# Patient Record
Sex: Male | Born: 1980 | Race: Black or African American | Hispanic: No | Marital: Single | Smoking: Current every day smoker
Health system: Southern US, Community
[De-identification: ages and names within clinical notes are randomized; demographics above are authoritative.]

## PROBLEM LIST (undated history)

## (undated) DIAGNOSIS — F1010 Alcohol abuse, uncomplicated: Secondary | ICD-10-CM

## (undated) DIAGNOSIS — K2920 Alcoholic gastritis without bleeding: Secondary | ICD-10-CM

## (undated) HISTORY — PX: FOREARM SURGERY: SHX651

---

## 2007-04-05 ENCOUNTER — Emergency Department (HOSPITAL_COMMUNITY): Admission: EM | Admit: 2007-04-05 | Discharge: 2007-04-05 | Payer: Self-pay | Admitting: Emergency Medicine

## 2008-02-21 ENCOUNTER — Emergency Department (HOSPITAL_COMMUNITY): Admission: EM | Admit: 2008-02-21 | Discharge: 2008-02-21 | Payer: Self-pay | Admitting: Emergency Medicine

## 2009-11-09 ENCOUNTER — Emergency Department (HOSPITAL_COMMUNITY): Admission: EM | Admit: 2009-11-09 | Discharge: 2009-11-09 | Payer: Self-pay | Admitting: Emergency Medicine

## 2010-04-28 ENCOUNTER — Observation Stay (HOSPITAL_COMMUNITY)
Admission: EM | Admit: 2010-04-28 | Discharge: 2010-04-29 | Payer: Self-pay | Source: Home / Self Care | Admitting: Emergency Medicine

## 2010-05-29 ENCOUNTER — Emergency Department (HOSPITAL_COMMUNITY)
Admission: EM | Admit: 2010-05-29 | Discharge: 2010-05-30 | Payer: Self-pay | Source: Home / Self Care | Admitting: Emergency Medicine

## 2010-08-18 LAB — POCT I-STAT, CHEM 8
BUN: 7 mg/dL (ref 6–23)
Calcium, Ion: 1.05 mmol/L — ABNORMAL LOW (ref 1.12–1.32)
HCT: 44 % (ref 39.0–52.0)
Hemoglobin: 15 g/dL (ref 13.0–17.0)

## 2010-08-18 LAB — ABO/RH: ABO/RH(D): O NEG

## 2010-08-18 LAB — CBC
MCH: 33.3 pg (ref 26.0–34.0)
MCHC: 34.5 g/dL (ref 30.0–36.0)
RDW: 13.1 % (ref 11.5–15.5)

## 2010-08-18 LAB — HEMOGLOBIN AND HEMATOCRIT, BLOOD: Hemoglobin: 11.2 g/dL — ABNORMAL LOW (ref 13.0–17.0)

## 2010-08-18 LAB — DIFFERENTIAL
Basophils Absolute: 0 10*3/uL (ref 0.0–0.1)
Lymphocytes Relative: 25 % (ref 12–46)
Neutro Abs: 4.3 10*3/uL (ref 1.7–7.7)
Neutrophils Relative %: 66 % (ref 43–77)

## 2010-08-18 LAB — BASIC METABOLIC PANEL
CO2: 26 mEq/L (ref 19–32)
Chloride: 104 mEq/L (ref 96–112)
Creatinine, Ser: 0.85 mg/dL (ref 0.4–1.5)
Potassium: 3.8 mEq/L (ref 3.5–5.1)

## 2010-08-18 LAB — TYPE AND SCREEN
ABO/RH(D): O NEG
Antibody Screen: NEGATIVE

## 2011-03-08 LAB — COMPREHENSIVE METABOLIC PANEL
ALT: 11
BUN: 5 — ABNORMAL LOW
CO2: 26
Calcium: 9.4
Chloride: 105
GFR calc Af Amer: >60
GFR calc non Af Amer: >60
Potassium: 3.4 — ABNORMAL LOW
Sodium: 140
Total Bilirubin: 1.6 — ABNORMAL HIGH
Total Protein: 7

## 2011-03-08 LAB — POCT I-STAT, CHEM 8
BUN: 6
Calcium, Ion: 1.22
Chloride: 101
Creatinine, Ser: 1.2
Glucose, Bld: 120 — ABNORMAL HIGH
HCT: 46
Sodium: 140

## 2011-03-08 LAB — LIPASE, BLOOD: Lipase: 22

## 2012-02-10 ENCOUNTER — Emergency Department (HOSPITAL_COMMUNITY)
Admission: EM | Admit: 2012-02-10 | Discharge: 2012-02-11 | Disposition: A | Discharge status: Home / Self Care | Payer: Self-pay | Attending: Emergency Medicine | Admitting: Emergency Medicine

## 2012-02-10 ENCOUNTER — Emergency Department (HOSPITAL_COMMUNITY): Payer: Self-pay

## 2012-02-10 ENCOUNTER — Encounter (HOSPITAL_COMMUNITY): Payer: Self-pay | Admitting: Emergency Medicine

## 2012-02-10 DIAGNOSIS — M21339 Wrist drop, unspecified wrist: Secondary | ICD-10-CM | POA: Insufficient documentation

## 2012-02-10 DIAGNOSIS — F17200 Nicotine dependence, unspecified, uncomplicated: Secondary | ICD-10-CM | POA: Insufficient documentation

## 2012-02-10 DIAGNOSIS — R209 Unspecified disturbances of skin sensation: Secondary | ICD-10-CM | POA: Insufficient documentation

## 2012-02-10 NOTE — Progress Notes (Signed)
Orthopedic Tech Progress Note Patient Details:  JACKIE LITTLEJOHN Dec 25, 1980 [id number]  Ortho Devices Type of Ortho Device: Velcro wrist splint (bilateral) Ortho Device/Splint Location: LUE AND RUE  Ortho Device/Splint Interventions: Application   Kimberlly Norgard 02/10/2012, 11:13 PM

## 2012-02-10 NOTE — ED Notes (Addendum)
Pt reports when he woke up at 9:30- 10 am, he woke up and has been unable to bend bil wrists; pt denies injury; denies pain, + sensation, strong radial pulse, <2sec cap refill; no noted deformity; reports when went to sleep, his hands were normal; pt is able to use hands, just not bend at wrists

## 2012-02-10 NOTE — ED Notes (Signed)
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

## 2012-02-10 NOTE — ED Notes (Signed)
Name called no answer 

## 2012-02-12 NOTE — ED Provider Notes (Signed)
History     CSN: [id number]  Arrival date & time 02/10/12  1933   First MD Initiated Contact with Patient 02/10/12 2152      Chief Complaint  Patient presents with  . Hand Problem    (Consider location/radiation/quality/duration/timing/severity/associated sxs/prior treatment) The history is provided by the patient. No language interpreter was used.  31yo healthy male c/o bilateral wrist drop since he awoke this am.  2+ radial pulse.  Fine motor to fingers weakened.  Denies injury or sleeping in a position to cause radial nerve inpingement.  Good sensation to bilateral hands.    History reviewed. No pertinent past medical history.  History reviewed. No pertinent past surgical history.  History reviewed. No pertinent family history.  History  Substance Use Topics  . Smoking status: Current Everyday Smoker -- 0.5 packs/day  . Smokeless tobacco: Not on file  . Alcohol Use: Yes     occasion   Cc:  31yo otherwise healthy male c/o bilateral wrist drop.     Review of Systems  Constitutional: Negative.   HENT: Negative.   Eyes: Negative.   Respiratory: Negative.   Cardiovascular: Negative.   Gastrointestinal: Negative.   Musculoskeletal:       Bilateral wrist drop  Neurological: Negative.   Psychiatric/Behavioral: Negative.   All other systems reviewed and are negative.    Allergies  Review of patient's allergies indicates no known allergies.  Home Medications  No current outpatient prescriptions on file.  BP 122/80  Pulse 64  Temp 98.9 F (37.2 C) (Oral)  Resp 18  SpO2 100%  Physical Exam  Nursing note and vitals reviewed. Constitutional: He is oriented to person, place, and time. He appears well-developed and well-nourished.  HENT:  Head: Normocephalic.  Eyes: Conjunctivae and EOM are normal. Pupils are equal, round, and reactive to light.  Neck: Normal range of motion. Neck supple.  Cardiovascular: Normal rate.   Pulmonary/Chest: Effort normal.    Abdominal: Soft.  Musculoskeletal: Normal range of motion.       Bilateral Wrist drop with +cs  Neurological: He is alert and oriented to person, place, and time. No cranial nerve deficit.  Skin: Skin is warm and dry.  Psychiatric: He has a normal mood and affect.    ED Course  Procedures (including critical care time) Spoke with dr. Roseanne Reno who recommended CT cervical spine and neurologist follow up.   Labs Reviewed - No data to display Ct Cervical Spine Wo Contrast  02/11/2012  *RADIOLOGY REPORT*  Clinical Data: Bilateral hand numbness with wrist drop.  CT CERVICAL SPINE WITHOUT CONTRAST  Technique:  Multidetector CT imaging of the cervical spine was performed. Multiplanar CT image reconstructions were also generated.  Comparison: None.  Findings: The cervical alignment is normal.  There is no evidence of acute fracture or traumatic subluxation.  No acute soft tissue findings are demonstrated.  There is no osseous foraminal stenosis. A tiny calcified granuloma is noted at the right lung apex.  IMPRESSION: Negative cervical spine CT.   Original Report Authenticated By: Gerrianne Scale, M.D.      1. Wrist drop       MDM  Bilateral wrist drop upon awakening around 10am.  CT cervical spine shows no cause.  Shared visit with Dr. Lorenso Courier.  Bilateral wrist splints with neurology followup.  Patient and father agree.  Return if worse with headache or worsening weakness.          Remi Haggard, NP 02/12/12 [phone]

## 2012-02-18 NOTE — ED Provider Notes (Signed)
Medical screening examination/treatment/procedure(s) were conducted as a shared visit with non-physician practitioner(s) and myself.  I personally evaluated the patient during the encounter  Tobin Chad, MD 02/18/12 1457

## 2012-02-28 ENCOUNTER — Inpatient Hospital Stay (HOSPITAL_COMMUNITY)
Admission: EM | Admit: 2012-02-28 | Discharge: 2012-03-02 | DRG: 074 | Disposition: A | Discharge status: Home / Self Care | Payer: MEDICAID | Attending: Internal Medicine | Admitting: Internal Medicine

## 2012-02-28 ENCOUNTER — Encounter (HOSPITAL_COMMUNITY): Payer: Self-pay | Admitting: Emergency Medicine

## 2012-02-28 DIAGNOSIS — Z823 Family history of stroke: Secondary | ICD-10-CM

## 2012-02-28 DIAGNOSIS — F17200 Nicotine dependence, unspecified, uncomplicated: Secondary | ICD-10-CM | POA: Diagnosis present

## 2012-02-28 DIAGNOSIS — R29898 Other symptoms and signs involving the musculoskeletal system: Secondary | ICD-10-CM

## 2012-02-28 DIAGNOSIS — M21339 Wrist drop, unspecified wrist: Secondary | ICD-10-CM | POA: Diagnosis present

## 2012-02-28 DIAGNOSIS — G587 Mononeuritis multiplex: Principal | ICD-10-CM | POA: Diagnosis present

## 2012-02-28 DIAGNOSIS — F1010 Alcohol abuse, uncomplicated: Secondary | ICD-10-CM | POA: Diagnosis present

## 2012-02-28 MED ORDER — NICOTINE 21 MG/24HR TD PT24
21.0000 mg | MEDICATED_PATCH | Freq: Every day | TRANSDERMAL | Status: DC
  Administered 2012-02-28 – 2012-03-02 (×4): 21 mg via TRANSDERMAL
  Filled 2012-02-28 (×4): qty 1

## 2012-02-28 NOTE — ED Provider Notes (Signed)
History     CSN: [id number]  Arrival date & time 02/28/12  1617   First MD Initiated Contact with Patient 02/28/12 1718      Chief Complaint  Patient presents with  . Wrist Pain    (Consider location/radiation/quality/duration/timing/severity/associated sxs/prior treatment) HPI Comments: Wesley Stewart 31 y.o. male   The chief complaint is: Patient presents with:   Wrist Pain   31 y/o male presents today with 2 weeks of bilateral wrist drop. Patient was seen and evaluated 2 weeks ago for sudden onset wrist drop.  States that he just woke up and was unable to extend his wrists. Patient states that he is unable to perform ADLS such as brushing his teeth. Patient is unable to extend at the wrist. He is also unable to grip tightly. He denies any injury to his neck. He denies any recent history of upper respiratory infection or other viral illness or systemic illness. He denies any recent immunizations. Denies any weakness in the legs. He denies any difficulty swallowing or with speech. He denies any visual changes or facial asymmetries. His gait is unaffected. No difficulty breathing. Patient was discharged 2 weeks ago with negative CT of the neck and told to followup with neurology. He is uninsured and has been unable to work to gain money to get a visit with outpatient neurology. He is here tonight because his symptoms have not resolved. He also has new onset numbness in the median nerve distribution bilaterally. He states that he does have some pain in his wrists, and rates it at a 4/10 constantly. Denies fevers, chills, myalgias, arthralgias, nausea, vomiting, diarrhea.       Patient is a 30 y.o. male presenting with wrist pain. The history is provided by the patient and medical records. No language interpreter was used.  Wrist Pain Associated symptoms include weakness. Pertinent negatives include no abdominal pain, chest pain, chills, fever, headaches, myalgias, nausea, neck pain,  rash or vomiting.    History reviewed. No pertinent past medical history.  Past Surgical History  Procedure Date  . Forearm surgery     No family history on file.  History  Substance Use Topics  . Smoking status: Current Every Day Smoker -- 0.5 packs/day  . Smokeless tobacco: Not on file  . Alcohol Use: Yes     occasion      Review of Systems  Constitutional: Negative for fever and chills.  HENT: Negative for trouble swallowing, neck pain and voice change.   Eyes: Negative for visual disturbance.  Respiratory: Negative for shortness of breath.   Cardiovascular: Negative for chest pain.  Gastrointestinal: Negative for nausea, vomiting, abdominal pain and diarrhea.  Genitourinary: Negative for dysuria.  Musculoskeletal: Positive for gait problem. Negative for myalgias.  Skin: Negative for color change and rash.  Neurological: Positive for weakness. Negative for seizures, facial asymmetry, speech difficulty, light-headedness and headaches.    Allergies  Review of patient's allergies indicates no known allergies.  Home Medications  No current outpatient prescriptions on file.  BP 133/70  Pulse 78  Temp 98.2 F (36.8 C) (Oral)  Resp 20  Wt 150 lb (68.04 kg)  SpO2 98%  Physical Exam  Nursing note and vitals reviewed. Constitutional: He is oriented to person, place, and time. He appears well-developed and well-nourished. No distress.  HENT:  Head: Normocephalic and atraumatic.  Eyes: Conjunctivae normal and EOM are normal. Pupils are equal, round, and reactive to light. Right eye exhibits no discharge. Left eye exhibits no  discharge.  Neck: Normal range of motion. Neck supple. No tracheal deviation present. No thyromegaly present.  Cardiovascular: Normal rate, regular rhythm, normal heart sounds and intact distal pulses.  Exam reveals no gallop and no friction rub.   No murmur heard. Pulmonary/Chest: Effort normal and breath sounds normal. No respiratory distress.  He has no wheezes. He exhibits no tenderness.  Abdominal: Soft. Bowel sounds are normal. He exhibits no distension and no mass. There is no tenderness. There is no guarding.  Musculoskeletal:       Right wrist: He exhibits decreased range of motion. He exhibits no tenderness, no bony tenderness, no swelling, no effusion, no crepitus, no deformity and no laceration.       Left wrist: He exhibits decreased range of motion. He exhibits no tenderness, no bony tenderness, no swelling, no effusion, no crepitus, no deformity and no laceration.  Lymphadenopathy:    He has no cervical adenopathy.  Neurological: He is alert and oriented to person, place, and time. He displays no tremor. No cranial nerve deficit or sensory deficit. He exhibits normal muscle tone (decreased muscle tone in the forearm muscles, particularly in the forearm extensors.). GCS eye subscore is 4. GCS verbal subscore is 5. GCS motor subscore is 6.       Decreased tone of the forearm extensors may be borderline on atrophy. Patient has decreased grip strength bilaterally, and unable to form a fist. He is totally unable to extend at the wrist. He does have some flexion. Strength is markedly diminished. There is decreased strength of biceps and tricep, bilaterally. Sensation is grossly intact in the fingers, although patient does report numbness and tingling in the median nerve distribution. DTRs appear normal. Patient has full strength at the shoulder. There is no involvement of the lower extremities.  Skin: He is not diaphoretic.    ED Course  Procedures (including critical care time)  Labs Reviewed - No data to display No results found.   1. Weakness of wrist       MDM  The patient is uninsured and unable to complete ADLs. I feel that he feels that he is appropriate for admission for further workup for his bilateral wrist drop of unknown etiology. He may have a Guillain-Barr syndrome, however, there is no involvement of the  lower extremities, and this would be a very atypical presentation. Patient is a smoker, but has no other known risk factors for stroke and bilateral presentation does not necessarily  lineup with stroke presentation. Not feel that the patient needs further workup at this time. Spoke with Dr. Jeraldine Loots, who agrees with my plan. Consulted Dr. Amada Jupiter with inpatient neurology, who agrees the patient needs admission and will come to the ED to assess and see him here.    I have spoken with internal medicine who has agreed to to admit the patient. Patient understands and agrees with plan.        Arthor Captain, PA-C 03/01/12 2112

## 2012-02-28 NOTE — ED Notes (Signed)
Pt seen at [facility] two weeks ago for inability to move wrists.  No reported injury and patient was diagnosed with wrist drop.  Pt wearing wrist splints bilaterally since then.  Did not follow up with a neurologist per instructions.  Now he returns with bilateral forearm and wrist numbness.  Good pulses bilaterally and able to move fingers.  Cannot bend wrists.

## 2012-02-28 NOTE — Consult Note (Addendum)
Reason for Consult: Bilateral wrist drop Referring Physician: Remi Haggard NP  CC: Bilateral wrist weakness  History is obtained from: Patient  HPI: Wesley Stewart is an 31 y.o. male presented with 2 weeks of bilateral wrist weakness. He states that he woke up 2 weeks ago and was unable to extend his wrist bilaterally. Since that time, his weakness has been stable but are getting worse or better. He has, however noticed that he has been getting numbness that seems to be involving more and more of his distal arms. He also notes pain in his arms bilaterally. He denies any neck pain. Denies any injury to either his arms or his neck prior to starting.  He did not sleep in any unusual position the night that this happened, consent that is normal.  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  Past medical history None   Family History: Grandmother-breast cancer No history of autoimmune diseases as far as he is aware  Social History: Tob: Positive, half pack a day  Exam: Current vital signs: BP 133/70  Pulse 78  Temp 98.2 F (36.8 C) (Oral)  Resp 20  Wt 68.04 kg (150 lb)  SpO2 98% Vital signs in last 24 hours: Temp:  [98.2 F (36.8 C)] 98.2 F (36.8 C) (09/23 1729) Pulse Rate:  [78] 78  (09/23 1729) Resp:  [20] 20  (09/23 1729) BP: (133)/(70) 133/70 mmHg (09/23 1729) SpO2:  [98 %] 98 % (09/23 1729) Weight:  [68.04 kg (150 lb)] 68.04 kg (150 lb) (09/23 1729)  General: Sitting in a chair, watching TV  CV: Regular rate and rhythm  Mental Status: Patient is awake, alert, oriented to person, place, month, year, and situation. Immediate and remote memory are intact. Patient is able to give a clear and coherent history. Speech is fluent and non-dysarthric  Cranial Nerves: II: Visual Fields are full. Pupils are equal, round, and reactive to light.  Discs are  sharp. III,IV, VI: EOMI without ptosis or diploplia.  V,VII: Facial sensation and movement are symmetric.  VIII: hearing is intact to voice X: Uvula elevates symmetrically XI: Shoulder shrug is symmetric. XII: tongue is midline without atrophy or fasciculations.  Motor: Tone is normal. Bulk is normal.      Right  Left Shoulder Abduction  5/5  5/5 Elbow Flexion   5/5  5/5 Elbow Extension  5/5  5/5 Wrist Flexion   4/5  4/5 Wrist Extension  0/5  0/5 Interossei   3/5  3/5 Grip: 5/5 bilaterally if hand is held in neutral position.  Apb: 1/5 bilterally  Hip Flexion   5/5  5/5 Knee Flexion   5/5  5/5 Knee Extension  5/5  5/5 Ankle Dorsiflexion  5/5  5/5 Ankle Plantarflexion  5/5  5/5       Sensory: Sensation is symmetric to light touch and temperature in the legs. Vibration is intact in the legs. In the arms, he has circumferentially decreased sensation to temperature and vibration to the elbow bilaterally.  Deep Tendon Reflexes: 2+ and symmetric in the biceps, triceps, 1+ at the BR and 2+ at the patellae.  Plantars: Toes are downgoing bilaterally.  Cerebellar: No dysmetria, FNF limited by wrist weakness Gait: Patient has a stable casual gait. Able to tandem, heel, and toe walk.   Problems addressed:Weakness, Progressive numbness  Impression: 31 yo M with  2 weeks of sudden onset symmetric weakness followed by symmetric circumferential arm numbness that has been progressively getting worse. With the symmetry, I am concerned for possible C-Spine disease(inflammatory or structural), but also in the differential would be GBS variant vs mononeuritis multiplex. His weakness fits a C8 distribution, however his numbness is more circumferential. Mulitple nerves are involved (sensory distribution is median, ulnar and radial, weakness is also radial, ulnar and median) making think that a mononeuritis multiplex is less likely. Multifocal motor neuropathy could be a possibility as well.    Recommendations: 1) MRI C-spine with and without contrast.  2) If this is negative, then would arrange LP under fluoro with Cellcount/diff, Protein, Glucose.  3) Nerve conduction studies could also be very helpful, however this is typically not possible as an inpatient.  4) OT consult.    Ritta Slot, MD [facility] [phone]

## 2012-02-29 ENCOUNTER — Inpatient Hospital Stay (HOSPITAL_COMMUNITY): Payer: Self-pay

## 2012-02-29 ENCOUNTER — Encounter (HOSPITAL_COMMUNITY): Payer: Self-pay | Admitting: Family Medicine

## 2012-02-29 DIAGNOSIS — R29898 Other symptoms and signs involving the musculoskeletal system: Secondary | ICD-10-CM | POA: Insufficient documentation

## 2012-02-29 LAB — COMPREHENSIVE METABOLIC PANEL
ALT: 12 U/L (ref 0–53)
AST: 21 U/L (ref 0–37)
Albumin: 3.7 g/dL (ref 3.5–5.2)
Alkaline Phosphatase: 62 U/L (ref 39–117)
BUN: 15 mg/dL (ref 6–23)
CO2: 27 mEq/L (ref 19–32)
GFR calc Af Amer: >90 mL/min (ref >90)
GFR calc non Af Amer: >90 mL/min (ref >90)
Sodium: 133 mEq/L — ABNORMAL LOW (ref 135–145)
Total Bilirubin: 0.6 mg/dL (ref 0.3–1.2)
Total Protein: 7 g/dL (ref 6.0–8.3)

## 2012-02-29 LAB — CBC WITH DIFFERENTIAL/PLATELET
Lymphocytes Relative: 46 % (ref 12–46)
Lymphs Abs: 2.4 10*3/uL (ref 0.7–4.0)
MCH: 32.4 pg (ref 26.0–34.0)
Monocytes Relative: 9 % (ref 3–12)
Neutrophils Relative %: 43 % (ref 43–77)
Platelets: 257 10*3/uL (ref 150–400)
WBC: 5.2 10*3/uL (ref 4.0–10.5)

## 2012-02-29 LAB — URINALYSIS, ROUTINE W REFLEX MICROSCOPIC
Bilirubin Urine: NEGATIVE
Ketones, ur: NEGATIVE mg/dL
Leukocytes, UA: NEGATIVE
Nitrite: NEGATIVE
Specific Gravity, Urine: 1.016 (ref 1.005–1.030)
Urobilinogen, UA: 0.2 mg/dL (ref 0.0–1.0)
pH: 6 (ref 5.0–8.0)

## 2012-02-29 LAB — BASIC METABOLIC PANEL
Calcium: 9.1 mg/dL (ref 8.4–10.5)
Creatinine, Ser: 1 mg/dL (ref 0.50–1.35)
GFR calc Af Amer: >90 mL/min (ref >90)
Sodium: 136 mEq/L (ref 135–145)

## 2012-02-29 LAB — CBC
MCH: 32.8 pg (ref 26.0–34.0)
MCV: 93.6 fL (ref 78.0–100.0)
Platelets: 233 10*3/uL (ref 150–400)
RBC: 4.54 MIL/uL (ref 4.22–5.81)
RDW: 14.8 % (ref 11.5–15.5)
WBC: 5.3 10*3/uL (ref 4.0–10.5)

## 2012-02-29 MED ORDER — SODIUM CHLORIDE 0.9 % IJ SOLN: 3.0000 mL | INTRAMUSCULAR | Status: DC | PRN

## 2012-02-29 MED ORDER — METHOCARBAMOL 500 MG PO TABS
500.0000 mg | ORAL_TABLET | Freq: Four times a day (QID) | ORAL | Status: DC | PRN
  Administered 2012-02-29 – 2012-03-01 (×3): 500 mg via ORAL
  Filled 2012-02-29 (×3): qty 1

## 2012-02-29 MED ORDER — ONDANSETRON HCL 4 MG PO TABS: 4.0000 mg | ORAL_TABLET | Freq: Four times a day (QID) | ORAL | Status: DC | PRN

## 2012-02-29 MED ORDER — GADOBENATE DIMEGLUMINE 529 MG/ML IV SOLN
14.0000 mL | Freq: Once | INTRAVENOUS | Status: AC | PRN
  Administered 2012-02-29: 14 mL via INTRAVENOUS

## 2012-02-29 MED ORDER — ZOLPIDEM TARTRATE 5 MG PO TABS: 5.0000 mg | ORAL_TABLET | Freq: Every evening | ORAL | Status: DC | PRN

## 2012-02-29 MED ORDER — PNEUMOCOCCAL VAC POLYVALENT 25 MCG/0.5ML IJ INJ
0.5000 mL | INJECTION | INTRAMUSCULAR | Status: AC
  Filled 2012-02-29: qty 0.5

## 2012-02-29 MED ORDER — HYDROCODONE-ACETAMINOPHEN 5-325 MG PO TABS
1.0000 | ORAL_TABLET | ORAL | Status: DC | PRN
  Administered 2012-02-29 – 2012-03-02 (×8): 1 via ORAL
  Filled 2012-02-29 (×8): qty 1

## 2012-02-29 MED ORDER — ENOXAPARIN SODIUM 40 MG/0.4ML ~~LOC~~ SOLN
40.0000 mg | SUBCUTANEOUS | Status: DC
  Administered 2012-02-29: 40 mg via SUBCUTANEOUS
  Filled 2012-02-29: qty 0.4

## 2012-02-29 MED ORDER — INFLUENZA VIRUS VACC SPLIT PF IM SUSP
0.5000 mL | INTRAMUSCULAR | Status: AC
  Filled 2012-02-29: qty 0.5

## 2012-02-29 MED ORDER — SENNOSIDES-DOCUSATE SODIUM 8.6-50 MG PO TABS
1.0000 | ORAL_TABLET | Freq: Every evening | ORAL | Status: DC | PRN
  Filled 2012-02-29: qty 1

## 2012-02-29 MED ORDER — ALUM & MAG HYDROXIDE-SIMETH 200-200-20 MG/5ML PO SUSP: 30.0000 mL | Freq: Four times a day (QID) | ORAL | Status: DC | PRN

## 2012-02-29 MED ORDER — ACETAMINOPHEN 325 MG PO TABS
650.0000 mg | ORAL_TABLET | Freq: Four times a day (QID) | ORAL | Status: DC | PRN
  Administered 2012-02-29 (×2): 650 mg via ORAL
  Filled 2012-02-29 (×2): qty 2

## 2012-02-29 MED ORDER — ACETAMINOPHEN 650 MG RE SUPP: 650.0000 mg | Freq: Four times a day (QID) | RECTAL | Status: DC | PRN

## 2012-02-29 MED ORDER — ONDANSETRON HCL 4 MG/2ML IJ SOLN: 4.0000 mg | Freq: Four times a day (QID) | INTRAMUSCULAR | Status: DC | PRN

## 2012-02-29 NOTE — Progress Notes (Signed)
UR completed 

## 2012-02-29 NOTE — Progress Notes (Signed)
CARE MANAGEMENT NOTE 02/29/2012  Patient:  Wesley Stewart, Wesley Stewart   Account Number:  [id number]  Date Initiated:  02/29/2012  Documentation initiated by:  Colleen Can  Subjective/Objective Assessment:   DX BILATERAL WRIST DROP  PT IS SELFPAY ADMISSION  From home with Grandmother     Action/Plan:   CM spoke with patient. Plans to go stay with cousin in [place] upon discharge.  States he does not have PCP. Does not have medical insurance. 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 necessity/level of care/duration of stay   Comments:  02/29/2012 referral made to [facility]. CM will con't to follow

## 2012-02-29 NOTE — Evaluation (Signed)
Occupational Therapy Evaluation Patient Details Name: Wesley Stewart MRN: [id number] DOB: 06/06/1981 Today's Date: 02/29/2012 Time: 1191-4782 OT Time Calculation (min): 26 min  OT Assessment / Plan / Recommendation Clinical Impression  Pt is 31 years old and presents with bilateral wrist drop. He will benefit from skilled OT services to monitor symptoms, provide AE education PRN/compensatory strategies and increase independence. Awaiting MRI today.     OT Assessment  Patient needs continued OT Services    Follow Up Recommendations  Outpatient OT    Barriers to Discharge      Equipment Recommendations  None recommended by OT    Recommendations for Other Services    Frequency  Min 2X/week    Precautions / Restrictions Precautions Precaution Comments: bilateral wrist drop        ADL  Eating/Feeding: Simulated;Independent;Other (comment) (to drink from a cup with compensatory strategy. ) Where Assessed - Eating/Feeding: Other (comment) (standing.) Grooming: Performed;Teeth care;Independent Where Assessed - Grooming: Unsupported standing;Other (comment) (while in wrist supports. difficult and painful) Upper Body Bathing: Other (comment) (not assessed) Lower Body Bathing: Other (comment) (not assessed) Upper Body Dressing: Other (comment) (not assessed) Lower Body Dressing: Simulated;Independent;Other (comment) (with socks only. difficult) Where Assessed - Lower Body Dressing: Unsupported sitting Toilet Transfer: Simulated;Independent Toilet Transfer Method: Other (comment) (transfer into bathroom and out) Toileting - Architect and Hygiene: Simulated;Independent Where Assessed - Toileting Clothing Manipulation and Hygiene: Standing Tub/Shower Transfer Method: Not assessed Equipment Used: Other (comment) (bilateral wrist supports. ) ADL Comments: Observed pt brush teeth at sink. pt able to manipulate and open box that toothpaste is in and open tube/apply to  toothbrush and complete brushing. he states this is very difficult and he has sharp pain that runs down his R forearm when he brushes teeth. Pt able to pick up a cup and drink using compensatory strategy. (picking up cup from top edge as he is unable to grasp around cup) He states eating breakfast was difficult and he mostly picked things up wtih his fingers to feed himself. he states he tried a fork at home  before coming to hospital and he could grip utensil and not drop utensil but the food would fall of fork when he brought fork to mouth. Pt declined bath currently and states he would try later. Will further assess ADL as able while on acute. He has limited support when he leaves hospital.     OT Diagnosis: Generalized weakness  OT Problem List: Decreased strength;Decreased range of motion;Impaired sensation OT Treatment Interventions: Self-care/ADL training;Therapeutic activities;Therapeutic exercise;Patient/family education   OT Goals Acute Rehab OT Goals OT Goal Formulation: With patient Time For Goal Achievement: 03/14/12 Potential to Achieve Goals: Good ADL Goals Additional ADL Goal #1: Will further assess self feeding and determine any AE needs/provide strategies for increased independence with task.  ADL Goal: Additional Goal #1 - Progress: Goal set today Additional ADL Goal #2: Will further assess pt ability to perform own bath/dress routine and determine any recommendations needed.  ADL Goal: Additional Goal #2 - Progress: Goal set today  Visit Information  Last OT Received On: 02/29/12 Assistance Needed: +1    Subjective Data  Subjective: I had trouble eating this morning Patient Stated Goal: to be able to use hands   Prior Functioning  Vision/Perception  Home Living Lives With: Family;Other (Comment) (cousin who works) Available Help at Discharge: Other (Comment) (cousin works. 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 able to flex digits but not able to extend. No abduction or adduction of digits. Pt with bilateral wrist braces RUE Sensation: Deficits RUE Sensation Deficits: pt states no sensation below elbow to fingertips. Not able to detect light touch any areas that OT tested elbow to distal.  Left Upper Extremity Assessment LUE ROM/Strength/Tone: Deficits LUE ROM/Strength/Tone Deficits: same as above LUE Sensation: Deficits LUE Sensation Deficits: same as above.    Mobility  Shoulder Instructions  Bed Mobility Bed Mobility: Supine to Sit Supine to Sit: 7: Independent Transfers Transfers: Sit to Stand;Stand to Sit Sit to Stand: 7: Independent Stand to Sit: 7: Independent       Exercise     Balance Balance Balance Assessed: Yes Dynamic Standing Balance Dynamic Standing - Level of Assistance: 7: Independent   End of Session OT - End of Session Activity Tolerance: Patient tolerated treatment well Patient left: in bed;with call bell/phone within reach  GO     Lennox Laity [phone] 02/29/2012, 11:59 AM

## 2012-02-29 NOTE — Progress Notes (Signed)
[facility] PROGRESS NOTE  Wesley Stewart RUE:[id number] DOB: 1981-03-25 DOA: 02/28/2012 PCP: Sheila Oats, MD  Assessment/Plan: 1. Sudden onset symmetric weakness/bilateral wrist drop--follow-up MRI cervical spine. Further evaluation per neurology. Check HIV.  Code Status: full code Family Communication: none present Disposition Plan: home when stable  Brendia Sacks, MD  [facility] Team 6 Pager [phone]. If 8PM-8AM, please contact night-coverage at [email or web address], password [facility] 02/29/2012, 10:24 AM  LOS: 1 day   Brief narrative: 31 y.o. male presented with 2 weeks of sudden onset symmetric weakness followed by symmetric circumferential arm numbness that has been progressively getting worse.  Consultants:  Neurology   OT--outpatient OT  Procedures:    Antibiotics:    HPI/Subjective: No complaints except for weakness both hands/wrists.  Objective: Filed Vitals:   02/29/12 0034 02/29/12 0136 02/29/12 0159 02/29/12 0539  BP: 119/86 124/83  122/82  Pulse: 61 60  56  Temp: 98.4 F (36.9 C) 97.9 F (36.6 C)  97.8 F (36.6 C)  TempSrc: Oral Oral  Oral  Resp: 16 18  16   Height:   5\' 10"  (1.778 m)   Weight:      SpO2: 100% 100%  100%   No intake or output data in the 24 hours ending 02/29/12 1024 Filed Weights   02/28/12 1729  Weight: 68.04 kg (150 lb)    Exam:  General:  Appears calm and comfortable Cardiovascular: RRR, no m/r/g. No LE edema. Respiratory: CTA bilaterally, no w/r/r. Normal respiratory effort. Musculoskeletal: bilateral wrist drop noted, inability to move fingers Psychiatric: grossly normal mood and affect, speech fluent and appropriate  Data Reviewed: Basic Metabolic Panel:  Lab 02/29/12 6213 02/29/12 0016  NA 136 133*  K 3.9 3.7  CL 101 97  CO2 26 27  GLUCOSE 88 90  BUN 13 15  CREATININE 1.00 0.95  CALCIUM 9.1 9.5  MG -- 2.2  PHOS -- --   Liver Function Tests:  Lab 02/29/12 0016  AST 21  ALT 12  ALKPHOS 62   BILITOT 0.6  PROT 7.0  ALBUMIN 3.7   CBC:  Lab 02/29/12 0430 02/29/12 0016  WBC 5.3 5.2  NEUTROABS -- 2.2  HGB 14.9 15.3  HCT 42.5 44.3  MCV 93.6 93.9  PLT 233 257   Studies: No results found.  Scheduled Meds:   . enoxaparin (LOVENOX) injection  40 mg Subcutaneous Q24H  . influenza  inactive virus vaccine  0.5 mL Intramuscular Tomorrow-1000  . nicotine  21 mg Transdermal Daily  . pneumococcal 23 valent vaccine  0.5 mL Intramuscular Tomorrow-1000   Continuous Infusions:   Active Problems:  Weakness of wrist     Brendia Sacks, MD  [facility] Team 6 Pager [phone]. If 8PM-8AM, please contact night-coverage at [email or web address], password [facility] 02/29/2012, 10:24 AM  LOS: 1 day   Time spent: 20 minutes

## 2012-02-29 NOTE — ED Notes (Signed)
[id number]> Expected date:<BR> Expected time:<BR> Means of arrival:<BR> Comments:<BR> Detox; DT

## 2012-02-29 NOTE — Progress Notes (Signed)
[facility] PROGRESS [facility]    SUBJECTIVE   No complaints.  No improvement in bilateral wrist extension  OBJECTIVE   Vital signs in last 24 hours: Temp:  [97.8 F (36.6 C)-98.4 F (36.9 C)] 97.8 F (36.6 C) (09/24 0539) Pulse Rate:  [56-78] 56  (09/24 0539) Resp:  [16-20] 16  (09/24 0539) BP: (119-133)/(70-86) 122/82 mmHg (09/24 0539) SpO2:  [98 %-100 %] 100 % (09/24 0539) Weight:  [68.04 kg (150 lb)] 68.04 kg (150 lb) (09/23 1729)  Intake/Output from previous day:   Intake/Output this shift:   Nutritional status: General  History reviewed. No pertinent past medical history.  Neurologic ROS negative with exception of above. Musculoskeletal ROS weakness  Neurologic Exam:  Mental Status: Alert, oriented, thought content appropriate.  Speech fluent without evidence of aphasia.  Able to follow 3 step commands without difficulty. Cranial Nerves: II: visual fields grossly normal, pupils equal, round, reactive to light and accommodation III,IV, VI: ptosis not present, extraocular muscles extra-ocular motions intact bilaterally V,VII: smile symmetric, facial light touch sensation normal bilaterally VIII: hearing normal bilaterally IX,X: gag reflex present XI: trapezius strength/neck flexion strength normal bilaterally XII: tongue strength normal  Motor: Right : Upper extremity    Left:     Upper extremity 5/5 deltoid       5/5 deltoid 5/5 tricep      5/5 tricep 5/5 biceps      5/5 biceps  5/5wrist flexion     5/5 wrist flexion 0/5 wrist extension     0/5 wrist extension 2/5 hand grip      2/5 hand grip Bilaterally he has some flexion of fingers --greater in thumb and index finger.  No adduction abduction of fingers, no extension of MCP,PIP, DIP.  Lower extremity     Lower extremity 5/5 hip flexor      5/5 hip flexor 5/5 hip adductors     5/5 hip adductors 5/5 hip abductors     5/5 hip abductors 5/5 quadricep      5/5  quadriceps  5/5 hamstrings     5/5 hamstrings 5/5 plantar flexion       5/5 plantar flexion 5/5 plantar extension     5/5 plantar extension Tone and bulk:normal tone throughout; no atrophy noted Sensory: Pinprick and light touch intact throughout, bilaterally--Except bilaterally in both arms from one inch below elbow to fingers.  On the left arm he states he feels some sensation in T1 region otherwise no sensation to pp/lt.  Deep Tendon Reflexes:  Right: Upper Extremity   Left: Upper extremity   biceps (C-5 to C-6) 2/4   biceps (C-5 to C-6) 2/4 tricep (C7) 2/4    triceps (C7) 2/4 Brachioradialis (C6) 2/4  Brachioradialis (C6) 2/4  Lower Extremity Lower Extremity  quadriceps (L-2 to L-4) 2/4   quadriceps (L-2 to L-4) 2/4 Achilles (S1) 2/4   Achilles (S1) 2/4 Plantars: Right: downgoing   Left: downgoing Cerebellar: normal finger-to-nose,  normal heel-to-shin test normal gait and station   Lab Results: Results for orders placed during the hospital encounter of 02/28/12 (from the past 24 hour(s))  CBC WITH DIFFERENTIAL     Status: Normal   Collection Time   02/29/12 12:16 AM      Component Value Range  WBC 5.2  4.0 - 10.5 K/uL   RBC 4.72  4.22 - 5.81 MIL/uL   Hemoglobin 15.3  13.0 - 17.0 g/dL   HCT 16.1  09.6 - 04.5 %   MCV 93.9  78.0 - 100.0 fL   MCH 32.4  26.0 - 34.0 pg   MCHC 34.5  30.0 - 36.0 g/dL   RDW 40.9  81.1 - 91.4 %   Platelets 257  150 - 400 K/uL   Neutrophils Relative 43  43 - 77 %   Neutro Abs 2.2  1.7 - 7.7 K/uL   Lymphocytes Relative 46  12 - 46 %   Lymphs Abs 2.4  0.7 - 4.0 K/uL   Monocytes Relative 9  3 - 12 %   Monocytes Absolute 0.4  0.1 - 1.0 K/uL   Eosinophils Relative 2  0 - 5 %   Eosinophils Absolute 0.1  0.0 - 0.7 K/uL   Basophils Relative 1  0 - 1 %   Basophils Absolute 0.0  0.0 - 0.1 K/uL  COMPREHENSIVE METABOLIC PANEL     Status: Abnormal   Collection Time   02/29/12 12:16 AM      Component Value Range   Sodium 133 (*) 135 - 145 mEq/L    Potassium 3.7  3.5 - 5.1 mEq/L   Chloride 97  96 - 112 mEq/L   CO2 27  19 - 32 mEq/L   Glucose, Bld 90  70 - 99 mg/dL   BUN 15  6 - 23 mg/dL   Creatinine, Ser 7.82  0.50 - 1.35 mg/dL   Calcium 9.5  8.4 - 95.6 mg/dL   Total Protein 7.0  6.0 - 8.3 g/dL   Albumin 3.7  3.5 - 5.2 g/dL   AST 21  0 - 37 U/L   ALT 12  0 - 53 U/L   Alkaline Phosphatase 62  39 - 117 U/L   Total Bilirubin 0.6  0.3 - 1.2 mg/dL   GFR calc non Af Amer >90  >90 mL/min   GFR calc Af Amer >90  >90 mL/min  MAGNESIUM     Status: Normal   Collection Time   02/29/12 12:16 AM      Component Value Range   Magnesium 2.2  1.5 - 2.5 mg/dL  URINALYSIS, ROUTINE W REFLEX MICROSCOPIC     Status: Normal   Collection Time   02/29/12  1:05 AM      Component Value Range   Color, Urine YELLOW  YELLOW   APPearance CLEAR  CLEAR   Specific Gravity, Urine 1.016  1.005 - 1.030   pH 6.0  5.0 - 8.0   Glucose, UA NEGATIVE  NEGATIVE mg/dL   Hgb urine dipstick NEGATIVE  NEGATIVE   Bilirubin Urine NEGATIVE  NEGATIVE   Ketones, ur NEGATIVE  NEGATIVE mg/dL   Protein, ur NEGATIVE  NEGATIVE mg/dL   Urobilinogen, UA 0.2  0.0 - 1.0 mg/dL   Nitrite NEGATIVE  NEGATIVE   Leukocytes, UA NEGATIVE  NEGATIVE  CBC     Status: Normal   Collection Time   02/29/12  4:30 AM      Component Value Range   WBC 5.3  4.0 - 10.5 K/uL   RBC 4.54  4.22 - 5.81 MIL/uL   Hemoglobin 14.9  13.0 - 17.0 g/dL   HCT 21.3  08.6 - 57.8 %   MCV 93.6  78.0 - 100.0 fL   MCH 32.8  26.0 - 34.0 pg   MCHC 35.1  30.0 - 36.0 g/dL   RDW 56.3  87.5 - 64.3 %   Platelets 233  150 - 400 K/uL  BASIC METABOLIC PANEL     Status: Normal   Collection Time   02/29/12  4:30 AM      Component Value Range   Sodium 136  135 - 145 mEq/L   Potassium 3.9  3.5 - 5.1 mEq/L   Chloride 101  96 - 112 mEq/L   CO2 26  19 - 32 mEq/L   Glucose, Bld 88  70 - 99 mg/dL   BUN 13  6 - 23 mg/dL   Creatinine, Ser 3.29  0.50 - 1.35 mg/dL   Calcium 9.1  8.4 - 51.8 mg/dL   GFR calc non Af Amer >90  >90  mL/min   GFR calc Af Amer >90  >90 mL/min   Lipid Panel No results found for this basename: CHOL,TRIG,HDL,CHOLHDL,VLDL,LDLCALC in the last 72 hours  Studies/Results: No results found.  Medications:     Scheduled:   . enoxaparin (LOVENOX) injection  40 mg Subcutaneous Q24H  . influenza  inactive virus vaccine  0.5 mL Intramuscular Tomorrow-1000  . nicotine  21 mg Transdermal Daily  . pneumococcal 23 valent vaccine  0.5 mL Intramuscular Tomorrow-1000    Assessment/Plan:    Impression: 31 yo M with 2 weeks of sudden onset symmetric weakness followed by symmetric circumferential arm numbness that has been progressively getting worse. With the symmetry, I am concerned for possible C-Spine disease(inflammatory or structural), but also in the differential would be GBS variant vs mononeuritis multiplex. His weakness fits a C8 distribution, however his numbness is more circumferential. Mulitple nerves are involved (sensory distribution is median, ulnar and radial, weakness is also radial, ulnar and median) making think that a mononeuritis multiplex is less likely. Multifocal motor neuropathy could be a possibility as well.  Recommendations:  1) MRI C-spine with and without contrast.  2) If this is negative, then would arrange LP under fluoro with Cellcount/diff, Protein, Glucose.  3) Nerve conduction studies could also be very helpful, however this is typically not possible as an inpatient.  4) OT consult.      Felicie Morn PA-C [facility] [phone]  02/29/2012, 8:50 AM

## 2012-02-29 NOTE — H&P (Signed)
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 He came to the ER where imaging was done, it was unrevealing he was discharged home. However, he states his weakness has continued to worsen. He reports an episode of diarrhea for approximately a week, last week. He reports no other viral issues. He denies any fevers or chills. He denies any trauma. He states he is weak from the elbows down. He came back to the ER. He has been seen by a neurologist Dr. Amada Jupiter.  Review of Systems:  The patient denies anorexia, fever, weight loss,, vision loss, decreased hearing, hoarseness, chest pain, syncope, dyspnea on exertion, peripheral edema, balance deficits, hemoptysis, abdominal pain, melena, hematochezia, severe indigestion/heartburn, hematuria, incontinence, genital sores, suspicious skin lesions, transient blindness, difficulty walking, depression, unusual weight change, abnormal bleeding, enlarged lymph nodes, angioedema, and breast masses.  Past Medical History: History reviewed. No pertinent past medical history. Past Surgical History  Procedure Date  . Forearm surgery     Medications: Prior to Admission medications   Not on File    Allergies:  No Known Allergies  Social History:  reports that he has been smoking.  He does not have any smokeless tobacco history on file. He reports that he drinks alcohol. He reports that he does not use illicit drugs.  Family History: Family History  Problem Relation Age of Onset  . Stroke      Physical Exam: Filed Vitals:   02/28/12 1729 02/29/12 0034  BP: 133/70 119/86  Pulse: 78 61  Temp: 98.2 F (36.8 C) 98.4 F (36.9 C)  TempSrc: Oral Oral  Resp: 20 16  Weight: 68.04 kg (150 lb)   SpO2: 98% 100%    General:  Alert and oriented times three, well developed and nourished, no acute distress Eyes:  PERRLA, pink conjunctiva, no scleral icterus ENT: Moist oral mucosa, neck supple, no thyromegaly Lungs: clear to ascultation, no wheeze, no crackles, no use of accessory muscles Cardiovascular: regular rate and rhythm, no regurgitation, no gallops, no murmurs. No carotid bruits, no JVD Abdomen: soft, positive BS, non-tender, non-distended, no organomegaly, not an acute abdomen GU: not examined Neuro: CN II - XII grossly intact, sensation numbness below elbow bilaterally Musculoskeletal: Bilateral wrist drop, weakness bilateral hands  Skin: no rash, no subcutaneous crepitation, no decubitus Psych: appropriate patient   Labs on Admission:  No results found for this basename: NA:2,K:2,CL:2,CO2:2,GLUCOSE:2,BUN:2,CREATININE:2,CALCIUM:2,MG:2,PHOS:2 in the last 72 hours No results found for this basename: AST:2,ALT:2,ALKPHOS:2,BILITOT:2,PROT:2,ALBUMIN:2 in the last 72 hours No results found for this basename: LIPASE:2,AMYLASE:2 in the last 72 hours  Basename 02/29/12 0016  WBC 5.2  NEUTROABS 2.2  HGB 15.3  HCT 44.3  MCV 93.9  PLT 257   No results found for this basename: CKTOTAL:3,CKMB:3,CKMBINDEX:3,TROPONINI:3 in the last 72 hours No components found with this basename: POCBNP:3 No results found for this basename: DDIMER:2 in the last 72 hours No results found for this basename: HGBA1C:2 in the last 72 hours No results found for this basename: CHOL:2,HDL:2,LDLCALC:2,TRIG:2,CHOLHDL:2,LDLDIRECT:2 in the last 72 hours No results found for this basename: TSH,T4TOTAL,FREET3,T3FREE,THYROIDAB in the last 72 hours No results found for this basename: VITAMINB12:2,FOLATE:2,FERRITIN:2,TIBC:2,IRON:2,RETICCTPCT:2 in the last 72 hours  Micro Results: No results found for this or any previous visit (from the past 240 hour(s)).   Radiological Exams on Admission: No results found.  Assessment/Plan Present on Admission:  .Weakness of wrist Admit  to observation status neurology on board Will  consult occupational therapy Will order MRI of C-spine as recommended. See neurology  Dr. Amada Jupiter recommendation which include: C-Spine disease(inflammatory or structural), but also in the differential would be GBS variant vs mononeuritis multiplex. His weakness fits a C8 distribution, however his numbness is more circumferential. Mulitple nerves are involved (sensory distribution is median, ulnar and radial, weakness is also radial, ulnar and median) making think that a mononeuritis multiplex is less likely. Multifocal motor neuropathy could be a possibility as well. Neurology also recommends LP under fluoro with Cellcount/diff, Protein, Glucose if MRI is unrevealing.  Tobacco abuse Nicotine patch ordered   Full code DVT prophylaxis   Wesley Stewart 02/29/2012, 12:36 AM

## 2012-03-01 ENCOUNTER — Inpatient Hospital Stay (HOSPITAL_COMMUNITY): Payer: MEDICAID

## 2012-03-01 LAB — CSF CELL COUNT WITH DIFFERENTIAL: Tube #: 4

## 2012-03-01 LAB — PROTEIN AND GLUCOSE, CSF
Glucose, CSF: 66 mg/dL (ref 43–76)
Total  Protein, CSF: 24 mg/dL (ref 15–45)

## 2012-03-01 LAB — GRAM STAIN

## 2012-03-01 LAB — HIV ANTIBODY (ROUTINE TESTING W REFLEX): HIV: NONREACTIVE

## 2012-03-01 NOTE — Procedures (Signed)
Technique:  Informed consent was obtained from the patient prior to the procedure, including potential complications of headache, allergy, and pain.   With the patient prone, the lower back was prepped with Betadine.  1% Lidocaine was used for local anesthesia.  Lumbar puncture was performed at the [L4-5] level using a [20] gauge needle with return of [clear] CSF with an opening pressure of [11] cm water.   [9] ml of CSF were obtained for laboratory studies.  The patient tolerated the procedure well and there were no apparent complications. IMPRESSION: [Successful fluoroscopically guided lumbar puncture.

## 2012-03-01 NOTE — Progress Notes (Signed)
[facility] PROGRESS NOTE  Wesley Stewart ZOX:[id number] DOB: 09/28/1980 DOA: 02/28/2012 PCP: Wesley Oats, MD  Assessment/Plan: Active Problems:  Weakness and numbness of bilateral wrist CT cervical spine and MRI of the C-spine with and without contrast unremarkable. Ordered for lumbar puncture under fluoroscopy as per neurology recommendation and sent for cell count protein and glucose. Unlikely to be infectious and current differential could be GBS gradient versus mononeuritis multiplex. -Continue PT/OT -check HIV   Tobacco abuse: Counseled on smoking cessation. Nicotine patch  Code Status: Full Code Family Communication: None at bedside Disposition Plan: Home once stable   Brief narrative: 31 year old male with no medical history he presents with 2 weeks of bilateral wrist weakness with findings of bilateral wrist drop and numbness over her distal arms. Cervical spine CT and cervical MRI are normal. Plan on puncture.  Consultants:  Neurology  Procedures:  Ordered for LP  Antibiotics:  None  HPI/Subjective: Still has weakness or bilateral hands with this drop and loss of sensation below the elbows bilaterally  Objective: Filed Vitals:   02/29/12 0539 02/29/12 1443 02/29/12 2046 03/01/12 0551  BP: 122/82 120/75 119/78 111/74  Pulse: 56 75 71 75  Temp: 97.8 F (36.6 C) 97.3 F (36.3 C) 98.5 F (36.9 C) 97.5 F (36.4 C)  TempSrc: Oral Oral Oral Oral  Resp: 16 14 16 16   Height:      Weight:      SpO2: 100% 100% 100% 100%    Intake/Output Summary (Last 24 hours) at 03/01/12 1145 Last data filed at 02/29/12 2221  Gross per 24 hour  Intake    960 ml  Output      0 ml  Net    960 ml   Filed Weights   02/28/12 1729  Weight: 68.04 kg (150 lb)    Exam:   General:  Young male in no acute distress  HEENT: No pallor, moist oral mucosa  Cardiovascular: Normal S1 and S2 no murmurs rub or gallop  Respiratory: clear breath sounds bilaterally no  added sounds  Abdomen: soft, NT, ND BS+  Ext: warm, no edema  CNS: AAOX3, bilateral wrist drop, no sensation below the elbows b/l  Data Reviewed: Basic Metabolic Panel:  Lab 02/29/12 8119 02/29/12 0016  NA 136 133*  K 3.9 3.7  CL 101 97  CO2 26 27  GLUCOSE 88 90  BUN 13 15  CREATININE 1.00 0.95  CALCIUM 9.1 9.5  MG -- 2.2  PHOS -- --   Liver Function Tests:  Lab 02/29/12 0016  AST 21  ALT 12  ALKPHOS 62  BILITOT 0.6  PROT 7.0  ALBUMIN 3.7   No results found for this basename: LIPASE:5,AMYLASE:5 in the last 168 hours No results found for this basename: AMMONIA:5 in the last 168 hours CBC:  Lab 02/29/12 0430 02/29/12 0016  WBC 5.3 5.2  NEUTROABS -- 2.2  HGB 14.9 15.3  HCT 42.5 44.3  MCV 93.6 93.9  PLT 233 257   Cardiac Enzymes: No results found for this basename: CKTOTAL:5,CKMB:5,CKMBINDEX:5,TROPONINI:5 in the last 168 hours BNP (last 3 results) No results found for this basename: PROBNP:3 in the last 8760 hours CBG: No results found for this basename: GLUCAP:5 in the last 168 hours  No results found for this or any previous visit (from the past 240 hour(s)).   Studies: Mr Cervical Spine W Wo Contrast  02/29/2012  *RADIOLOGY REPORT*  Clinical Data: Bilateral wrist drop.  MRI CERVICAL SPINE WITHOUT AND WITH CONTRAST  Technique:  Multiplanar and multiecho pulse sequences of the cervical spine, to include the craniocervical junction and cervicothoracic junction, were obtained according to standard protocol without and with intravenous contrast.  Contrast: 14mL MULTIHANCE GADOBENATE DIMEGLUMINE 529 MG/ML IV SOLN  Comparison: CT 02/10/2012  Findings: Normal cervical alignment.  Negative for fracture or mass.  Normal enhancement.  No evidence of cervical infection or abscess.  Spinal cord signal is normal.  No cord compression or cord edema.  No significant disc degeneration.  No disc protrusion or spinal stenosis.  IMPRESSION: Normal   Original Report Authenticated  By: Camelia Phenes, M.D.     Scheduled Meds:   . influenza  inactive virus vaccine  0.5 mL Intramuscular Tomorrow-1000  . nicotine  21 mg Transdermal Daily  . pneumococcal 23 valent vaccine  0.5 mL Intramuscular Tomorrow-1000  . DISCONTD: enoxaparin (LOVENOX) injection  40 mg Subcutaneous Q24H   Continuous Infusions:     Time spent: 30 minutes    [name]  [facility] Pager [phone]. If 8PM-8AM, please contact night-coverage at [email or web address], password [facility] 03/01/2012, 11:45 AM  LOS: 2 days

## 2012-03-01 NOTE — Progress Notes (Signed)
Occupational Therapy Treatment Patient Details Name: KAELUM MCANULTY MRN: [id number] DOB: [birth year] Today's Date: 03/01/2012 Time: 5284-1324 OT Time Calculation (min): 52 min  OT Assessment / Plan / Recommendation Comments on Treatment Session This patient per therapist that saw him yesterday showing progress with finger ab/adduction (able today on flat surface, but not yesterday). UE use is still limited by inability to extend wrists thus causing pt to use accessory muscles to try and perform everyday tasks.    Follow Up Recommendations  Outpatient OT       Equipment Recommendations  None recommended by OT       Frequency Min 2X/week   Plan Discharge plan remains appropriate    Precautions / Restrictions Precautions Precaution Comments: Absent sensation for light touch and pain (pinch) Bil UEs elbows and distally       ADL  ADL Comments: Pt c/o pain in upper right arm when he tries to feed himself--question if due to awkward movements he is haviing to do with his digits to hold onto the spoon even in splints due to wrist drop (Bil)? Fitted him with RUE forearm based ucuff that can hold a utensil for him to see if this changes the pain in his upper arm. Pt is able to doff/don Bil UE pre-fabricated  black padded wrist cock-up splints and forearm U-cuff splint with increased time.       OT Goals ADL Goals ADL Goal: Additional Goal #1 - Progress: Progressing toward goals Arm Goals Additional Arm Goal #1: Pt will be independent in BIl UE AROM exercises/stretches to maintain and hopefully restore some function Arm Goal: Additional Goal #1 - Progress: Goal set today  Visit Information  Last OT Received On: 03/01/12 Assistance Needed:  (None)    Subjective Data  Subjective: My arms are numb up to my elbows      Cognition  Overall Cognitive Status: Appears within functional limits for tasks assessed/performed Arousal/Alertness: Awake/alert Orientation Level: Appears intact for  tasks assessed Behavior During Session: [facility] for tasks performed       Exercises  Hand Exercises Digit Composite Abduction: AROM;Strengthening;Both (hand(s) on table or his lap, working on disassociation) Digit Composite Adduction: AROM;Strengthening Digit Lifts: AROM;Strengthening;Both;Seated;Standing;Other (comment) (hand(s) on table or his lap, working on disassociation) Other Exercises Other Exercises: Pt does not have any AROM wrist extension with or without gravity Bil. He does have ulnar and radial deviation but has to really concentrate on doing it. He has good finger ab/adduction with really having to focus on the  thumb. Finger lifts are also needed increased concentration (more so for thumb and pinky). He is capable of opposition of thumb to digits while in splints but not tip-to-tip control.   Balance     End of Session OT - End of Session Activity Tolerance: Patient tolerated treatment well (some frustration with not knowing what is wrong) Patient left: in bed (sitting EOB with finanical counselor in room)    Evette Georges [phone] 03/01/2012, 7:25 PM

## 2012-03-02 DIAGNOSIS — F1010 Alcohol abuse, uncomplicated: Secondary | ICD-10-CM | POA: Diagnosis present

## 2012-03-02 DIAGNOSIS — G587 Mononeuritis multiplex: Principal | ICD-10-CM | POA: Diagnosis present

## 2012-03-02 DIAGNOSIS — R29898 Other symptoms and signs involving the musculoskeletal system: Secondary | ICD-10-CM

## 2012-03-02 DIAGNOSIS — M21339 Wrist drop, unspecified wrist: Secondary | ICD-10-CM | POA: Diagnosis present

## 2012-03-02 NOTE — Progress Notes (Signed)
Subjective:  Patient with b/l wrist drop.  Patient seen at the bed side. He is awake, alert and follows commands. He mentions that he woke up few days ago and tried to use the rest room and was unable to turn the knob He is tolerating OT He has improved significantly. He mentions that he does live in an older home but not sure if it has any lead origin to it.  No other complaints, except Marijuana he does not abuse other drugs.   Objective: Current vital signs: BP 123/74  Pulse 57  Temp 97.8 F (36.6 C) (Oral)  Resp 16  Ht 5\' 10"  (1.778 m)  Wt 68.04 kg (150 lb)  BMI 21.52 kg/m2  SpO2 100% Vital signs in last 24 hours: Temp:  [97.8 F (36.6 C)-99 F (37.2 C)] 97.8 F (36.6 C) (09/26 0510) Pulse Rate:  [57-75] 57  (09/26 0510) Resp:  [16] 16  (09/26 0510) BP: (117-123)/(74-89) 123/74 mmHg (09/26 0510) SpO2:  [99 %-100 %] 100 % (09/26 0510)  Intake/Output from previous day: 09/25 0701 - 09/26 0700 In: 960 [P.O.:960] Out: -  Intake/Output this shift: Total I/O In: 240 [P.O.:240] Out: -  Nutritional status: General  Neurologic Exam: Mental Status: Alert, oriented, thought content appropriate.  Speech fluent without evidence of aphasia.  Able to follow 3 step commands without difficulty. Cranial Nerves: II: visual fields grossly normal, pupils equal, round, reactive to light and accommodation III,IV, VI: ptosis not present, extra-ocular motions intact bilaterally V,VII: smile symmetric, facial light touch sensation normal bilaterally VIII: hearing normal bilaterally IX,X: gag reflex present XI: trapezius strength/neck flexion strength normal bilaterally XII: tongue strength normal  Motor:  Sensory: Pinprick and light touch intact throughout, bilaterally Deep Tendon Reflexes: 2+ and symmetric throughout  Muscle groupsMotor:  Right : Upper extremity    Left: Upper extremity  5/5 deltoid      5/5 deltoid  5/5 tricep      5-/5 tricep  5/5 biceps      5/5 biceps    5/5wrist flexion     5/5 wrist flexion  0/5 wrist extension      0/5 wrist extension  2+/5 hand grip     2+/5 hand grip  Interossesous: 2/5    2/5 Abbduction and Adduction    B/  2+  Right Lower extremity       Left Lower extremity  5/5       5/5   Lab Results: Results for orders placed during the hospital encounter of 02/28/12 (from the past 48 hour(s))  HIV ANTIBODY (ROUTINE TESTING)     Status: Normal   Collection Time   03/01/12  3:38 AM      Component Value Range Comment   HIV NON REACTIVE  NON REACTIVE   PROTEIN AND GLUCOSE, CSF     Status: Normal   Collection Time   03/01/12  2:12 PM      Component Value Range Comment   Glucose, CSF 66  43 - 76 mg/dL    Total  Protein, CSF 24  15 - 45 mg/dL   CSF CELL COUNT WITH DIFFERENTIAL     Status: Abnormal   Collection Time   03/01/12  2:12 PM      Component Value Range Comment   Tube # 4      Color, CSF COLORLESS  COLORLESS    Appearance, CSF CLEAR  CLEAR    Supernatant NOT INDICATED      RBC Count, CSF 2 (*)  0 /cu mm    WBC, CSF 1  0 - 5 /cu mm    Segmented Neutrophils-CSF TOO FEW TO COUNT, SMEAR AVAILABLE FOR REVIEW  0 - 6 %    Other Cells, CSF RARE CELLS PRESENT     CSF CULTURE     Status: Normal (Preliminary result)   Collection Time   03/01/12  2:12 PM      Component Value Range Comment   Specimen Description CSF      Special Requests NONE      Gram Stain        Value: NO WBC SEEN     NO ORGANISMS SEEN     Gram Stain Report Called to,Read Back By and Verified With: Gram Stain Report Called to,Read Back By and Verified With: FARGO A AT 1628 03/01/12 BY LOVE T   Culture PENDING      Report Status PENDING     GRAM STAIN     Status: Normal   Collection Time   03/01/12  2:12 PM      Component Value Range Comment   Specimen Description CSF      Special Requests NONE      Gram Stain        Value: NO ORGANISMS SEEN     NO WBC SEEN     Gram Stain Report Called to,Read Back By and Verified With: FARGO,A. AT 1628 ON [id number]  BY LOVE,T.   Report Status 03/01/2012 FINAL       Recent Results (from the past 240 hour(s))  CSF CULTURE     Status: Normal (Preliminary result)   Collection Time   03/01/12  2:12 PM      Component Value Range Status Comment   Specimen Description CSF   Final    Special Requests NONE   Final    Gram Stain     Final    Value: NO WBC SEEN     NO ORGANISMS SEEN     Gram Stain Report Called to,Read Back By and Verified With: Gram Stain Report Called to,Read Back By and Verified With: FARGO A AT 1628 03/01/12 BY LOVE T   Culture PENDING   Incomplete    Report Status PENDING   Incomplete   GRAM STAIN     Status: Normal   Collection Time   03/01/12  2:12 PM      Component Value Range Status Comment   Specimen Description CSF   Final    Special Requests NONE   Final    Gram Stain     Final    Value: NO ORGANISMS SEEN     NO WBC SEEN     Gram Stain Report Called to,Read Back By and Verified With: FARGO,A. AT 1628 ON [id number] BY LOVE,T.   Report Status 03/01/2012 FINAL   Final     Lipid Panel No results found for this basename: CHOL,TRIG,HDL,CHOLHDL,VLDL,LDLCALC in the last 72 hours  Studies/Results: Ct Head Wo Contrast  03/01/2012  *RADIOLOGY REPORT*  Clinical Data: Bilateral wrist drop with upper extremity weakness.  CT HEAD WITHOUT CONTRAST  Technique:  Contiguous axial images were obtained from the base of the skull through the vertex without contrast.  Comparison: CT head 02/21/2008.  MRI cervical spine 02/29/2012.  Findings: There is no evidence for acute infarction, intracranial hemorrhage, mass lesion, hydrocephalus, or extra-axial fluid.  The cerebellum appears mildly atrophic, particularly superior vermis. Fourth ventricle is mildly enlarged. There is no appreciable cortical atrophy or supratentorial  large vessel infarct.  There is no white matter disease.  No tonsillar herniation is present. Calvarium is intact.  Clear sinuses and mastoids.  IMPRESSION: Mildly atrophic cerebellum,  typically superior vermis, without acute or focal intracranial abnormality.  Correlate with possible history of EtOH use.  No contraindications to lumbar puncture. Similar appearance compared with 2009.  No central cause is seen for bilateral upper extremity weakness with wrist drop.  Consider peripheral neuropathy.   Original Report Authenticated By: Elsie Stain, M.D.    Mr Cervical Spine W Wo Contrast  02/29/2012  *RADIOLOGY REPORT*  Clinical Data: Bilateral wrist drop.  MRI CERVICAL SPINE WITHOUT AND WITH CONTRAST  Technique:  Multiplanar and multiecho pulse sequences of the cervical spine, to include the craniocervical junction and cervicothoracic junction, were obtained according to standard protocol without and with intravenous contrast.  Contrast: 14mL MULTIHANCE GADOBENATE DIMEGLUMINE 529 MG/ML IV SOLN  Comparison: CT 02/10/2012  Findings: Normal cervical alignment.  Negative for fracture or mass.  Normal enhancement.  No evidence of cervical infection or abscess.  Spinal cord signal is normal.  No cord compression or cord edema.  No significant disc degeneration.  No disc protrusion or spinal stenosis.  IMPRESSION: Normal   Original Report Authenticated By: Camelia Phenes, M.D.    Dg Fluoro Guide Lumbar Puncture  03/01/2012  *RADIOLOGY REPORT*  Clinical Data:  Bilateral wrist drop.  DIAGNOSTIC LUMBAR PUNCTURE UNDER FLUOROSCOPIC GUIDANCE  Fluoroscopy time:  0.75 minutes.  Technique:  Informed consent was obtained from the patient prior to the procedure, including potential complications of headache, allergy, and pain.   With the patient prone, the lower back was prepped with Betadine.  1% Lidocaine was used for local anesthesia. Lumbar puncture was performed at the L4-5 level using a 20 gauge needle with return of clear CSF with an opening pressure of 11 cm water.   10 ml of CSF were obtained for laboratory studies.  The patient tolerated the procedure well and there were no apparent complications.   IMPRESSION: Successful lumbar puncture under fluoroscopy.   Original Report Authenticated By: Reyes Ivan, M.D.     Medications:  Scheduled:   . influenza  inactive virus vaccine  0.5 mL Intramuscular Tomorrow-1000  . nicotine  21 mg Transdermal Daily  . pneumococcal 23 valent vaccine  0.5 mL Intramuscular Tomorrow-1000    Assessment/Plan:   Patient with b/l wrist Drops with no other significant deficits This can be seen with   Lead Toxicity Whipples disease Tick bite Alcohol neuropathy Multiple etiology  Recommend: Patient needs outpatient work up with EMG, NCS Serum Lead and Carotene level  .Spent  30 minutes with the patient  Leveon Pelzer V-P Eilleen Kempf., MD., Ph.D.,MS 03/02/2012 11:50 AM

## 2012-03-02 NOTE — Discharge Summary (Addendum)
Physician Discharge Summary  Wesley Stewart ZOX:[id number] DOB: 12/10/1980 DOA: 02/28/2012  PCP:  Admit date: 02/28/2012 Discharge date: 03/02/2012  Recommendations for Outpatient Follow-up: outpatient nuerology follow up for possile EMS/ NCS. Follow lead and carotine level. Patient will be called from [facility] for outpatient visit.  Discharge Diagnoses:  Principal Problem:  *Mononeuritis multiplex  Active Problems:  Wrist drop, bilateral   Alcohol abuse   Discharge Condition: fair  Diet recommendation: Regular  Filed Weights   02/28/12 1729  Weight: 68.04 kg (150 lb)    History of present illness:   31 year old male with hx of etoh abuse  he presents with 2 weeks of bilateral wrist weakness with findings of bilateral wrist drop and numbness over her distal arms. Cervical spine CT and cervical MRI are normal. Plan on puncture.  Hospital Course:  Weakness and numbness of bilateral wrist  CT cervical spine and MRI of the C-spine with and without contrast unremarkable. LP was done with normal cell count , protein and , gm stain and cx. Appears to be secondary to mononeuritis multiplex. He does have hx of  etoh abuse. Neurology recommends for lead level and carotene level which has been ordered and should follow up as outpatient during neurology follow up. Patient is able to finger grasp but still unable to extend his hands. Appears to be slowly improving since admission. He is able to abduct and adduct his fingers as well. continue with wrist splints. Will provide outpatient OT .  -check HIV negative. Counseled on etoh cessation. ( drinks almost 6 packs beer daily)  Tobacco abuse: Counseled on smoking cessation.   Patient will be discharged home with outpatient neurology followup. He will be called from [facility] with an outpatient appointment in 2 weeks  Code Status: Full Code       Procedures:  LP on 9/25  Consultations:  [facility] (  neurology)  Discharge Exam: Filed Vitals:   03/01/12 0551 03/01/12 1447 03/01/12 2003 03/02/12 0510  BP: 111/74 117/76 123/89 123/74  Pulse: 75 63 75 57  Temp: 97.5 F (36.4 C) 99 F (37.2 C) 98.4 F (36.9 C) 97.8 F (36.6 C)  TempSrc: Oral Oral Oral Oral  Resp: 16 16 16 16   Height:      Weight:      SpO2: 100% 99% 99% 100%    General: Young male in no acute distress  HEENT: No pallor, moist oral mucosa  Cardiovascular: Normal S1 and S2 no murmurs rub or gallop  Respiratory: clear breath sounds bilaterally no added sounds  Abdomen: soft, NT, ND BS+  Ext: warm, no edema  CNS: AAOX3, bilateral wrist drop, limited sensation below the elbows b/l.. Able to finger grasp with partial abduction and adduction   Discharge Instructions     Medication List    Notice       You have not been prescribed any medications.            Follow-up Information    Please follow up. (will be called from [facility] for appt within  2 weeks)           The results of significant diagnostics from this hospitalization (including imaging, microbiology, ancillary and laboratory) are listed below for reference.    Significant Diagnostic Studies: Ct Head Wo Contrast  03/01/2012  *RADIOLOGY REPORT*  Clinical Data: Bilateral wrist drop with upper extremity weakness.  CT HEAD WITHOUT CONTRAST  Technique:  Contiguous axial images were obtained from the base of the  skull through the vertex without contrast.  Comparison: CT head 02/21/2008.  MRI cervical spine 02/29/2012.  Findings: There is no evidence for acute infarction, intracranial hemorrhage, mass lesion, hydrocephalus, or extra-axial fluid.  The cerebellum appears mildly atrophic, particularly superior vermis. Fourth ventricle is mildly enlarged. There is no appreciable cortical atrophy or supratentorial large vessel infarct.  There is no white matter disease.  No tonsillar herniation is present. Calvarium is intact.  Clear sinuses and  mastoids.  IMPRESSION: Mildly atrophic cerebellum, typically superior vermis, without acute or focal intracranial abnormality.  Correlate with possible history of EtOH use.  No contraindications to lumbar puncture. Similar appearance compared with 2009.  No central cause is seen for bilateral upper extremity weakness with wrist drop.  Consider peripheral neuropathy.   Original Report Authenticated By: Elsie Stain, M.D.    Ct Cervical Spine Wo Contrast  02/11/2012  *RADIOLOGY REPORT*  Clinical Data: Bilateral hand numbness with wrist drop.  CT CERVICAL SPINE WITHOUT CONTRAST  Technique:  Multidetector CT imaging of the cervical spine was performed. Multiplanar CT image reconstructions were also generated.  Comparison: None.  Findings: The cervical alignment is normal.  There is no evidence of acute fracture or traumatic subluxation.  No acute soft tissue findings are demonstrated.  There is no osseous foraminal stenosis. A tiny calcified granuloma is noted at the right lung apex.  IMPRESSION: Negative cervical spine CT.   Original Report Authenticated By: Gerrianne Scale, M.D.    Mr Cervical Spine W Wo Contrast  02/29/2012  *RADIOLOGY REPORT*  Clinical Data: Bilateral wrist drop.  MRI CERVICAL SPINE WITHOUT AND WITH CONTRAST  Technique:  Multiplanar and multiecho pulse sequences of the cervical spine, to include the craniocervical junction and cervicothoracic junction, were obtained according to standard protocol without and with intravenous contrast.  Contrast: 14mL MULTIHANCE GADOBENATE DIMEGLUMINE 529 MG/ML IV SOLN  Comparison: CT 02/10/2012  Findings: Normal cervical alignment.  Negative for fracture or mass.  Normal enhancement.  No evidence of cervical infection or abscess.  Spinal cord signal is normal.  No cord compression or cord edema.  No significant disc degeneration.  No disc protrusion or spinal stenosis.  IMPRESSION: Normal   Original Report Authenticated By: Camelia Phenes, M.D.    Dg  Fluoro Guide Lumbar Puncture  03/01/2012  *RADIOLOGY REPORT*  Clinical Data:  Bilateral wrist drop.  DIAGNOSTIC LUMBAR PUNCTURE UNDER FLUOROSCOPIC GUIDANCE  Fluoroscopy time:  0.75 minutes.  Technique:  Informed consent was obtained from the patient prior to the procedure, including potential complications of headache, allergy, and pain.   With the patient prone, the lower back was prepped with Betadine.  1% Lidocaine was used for local anesthesia. Lumbar puncture was performed at the L4-5 level using a 20 gauge needle with return of clear CSF with an opening pressure of 11 cm water.   10 ml of CSF were obtained for laboratory studies.  The patient tolerated the procedure well and there were no apparent complications.  IMPRESSION: Successful lumbar puncture under fluoroscopy.   Original Report Authenticated By: Reyes Ivan, M.D.     Microbiology: Recent Results (from the past 240 hour(s))  CSF CULTURE     Status: Normal (Preliminary result)   Collection Time   03/01/12  2:12 PM      Component Value Range Status Comment   Specimen Description CSF   Final    Special Requests NONE   Final    Gram Stain     Final  Value: NO WBC SEEN     NO ORGANISMS SEEN     Gram Stain Report Called to,Read Back By and Verified With: Gram Stain Report Called to,Read Back By and Verified With: FARGO A AT 1628 03/01/12 BY LOVE T   Culture NO GROWTH   Final    Report Status PENDING   Incomplete   GRAM STAIN     Status: Normal   Collection Time   03/01/12  2:12 PM      Component Value Range Status Comment   Specimen Description CSF   Final    Special Requests NONE   Final    Gram Stain     Final    Value: NO ORGANISMS SEEN     NO WBC SEEN     Gram Stain Report Called to,Read Back By and Verified With: FARGO,A. AT 1628 ON [id number] BY LOVE,T.   Report Status 03/01/2012 FINAL   Final      Labs: Basic Metabolic Panel:  Lab 02/29/12 6578 02/29/12 0016  NA 136 133*  K 3.9 3.7  CL 101 97  CO2 26 27   GLUCOSE 88 90  BUN 13 15  CREATININE 1.00 0.95  CALCIUM 9.1 9.5  MG -- 2.2  PHOS -- --   Liver Function Tests:  Lab 02/29/12 0016  AST 21  ALT 12  ALKPHOS 62  BILITOT 0.6  PROT 7.0  ALBUMIN 3.7   No results found for this basename: LIPASE:5,AMYLASE:5 in the last 168 hours No results found for this basename: AMMONIA:5 in the last 168 hours CBC:  Lab 02/29/12 0430 02/29/12 0016  WBC 5.3 5.2  NEUTROABS -- 2.2  HGB 14.9 15.3  HCT 42.5 44.3  MCV 93.6 93.9  PLT 233 257   Cardiac Enzymes: No results found for this basename: CKTOTAL:5,CKMB:5,CKMBINDEX:5,TROPONINI:5 in the last 168 hours BNP: BNP (last 3 results) No results found for this basename: PROBNP:3 in the last 8760 hours CBG: No results found for this basename: GLUCAP:5 in the last 168 hours  Time coordinating discharge: 40 minutes  Signed:  Thy Gullikson  [facility] 03/02/2012, 1:24 PM

## 2012-03-02 NOTE — Progress Notes (Signed)
CARE MANAGEMENT NOTE 03/02/2012  Patient:  Wesley Stewart, Wesley Stewart   Account Number:  [id number]  Date Initiated:  02/29/2012  Documentation initiated by:  Colleen Can  Subjective/Objective Assessment:   DX BILATERAL WRIST DROP  PT IS SELFPAY ADMISSION  From home with Grandmother     Action/Plan:   CM spoke with patient. Plans to go stay with cousin in [place] upon discharge.  States he does not have PCP. Does not have medical insurance. Was employed in [employer] injury up to several days ago until he starting w wrist drop   Anticipated DC Date:  03/03/2012   Anticipated DC Plan:  HOME/SELF CARE  In-house referral  Artist  PCP / [facility]      DC Planning Services  CM consult      [facility] Choice  NA   Choice offered to / List presented to:  NA   DME arranged  NA      DME agency  NA     HH arranged  NA      HH agency  NA   Status of service:  Completed, signed off Medicare Important Message given?  no (If response is "NO", the following Medicare IM given date fields will be blank) Date Medicare IM given:   Date Additional Medicare IM given:    Discharge Disposition:  Home/selfcare  Per UR Regulation:  Reviewed for med. necessity/level of care/duration of stay  If discussed at Long Length of Stay Meetings, dates discussed:    Comments:  03/02/2012 Raynelle Bring BSN CCM [phone] Pt was given resource information regarding finding primary care doctor which included phone number, websites, locations and criteria needed to be accepted by practice. Information regarding low cost meds included in material. Pt states he voices understanding . Pt is aware of free clinic that was open on Saturday. Pt states he has applied for medicaid.

## 2012-03-02 NOTE — ED Provider Notes (Signed)
Medical screening examination/treatment/procedure(s) were conducted as a shared visit with non-physician practitioner(s) and myself.  I personally evaluated the patient during the encounter On my exam this gentleman was in no distress.  Notably, he has bilateral inability to extend his wrists.  Given the progression of his symptoms we consulted neurology.  The patient was admitted for further evaluation and management  Gerhard Munch, MD 03/02/12 1725

## 2012-03-02 NOTE — Progress Notes (Addendum)
Occupational Therapy Treatment Patient Details Name: Wesley Stewart MRN: [id number] DOB: 10/08/1980 Today's Date: 03/02/2012 Time: 4098-1191 OT Time Calculation (min): 34 min  OT Assessment / Plan / Recommendation Comments on Treatment Session Pt tolerated exercises given and encouraged to perform sets of each several sets during the day to tolerance. Pt preferring black prefabricated wrist cock up splints and states ucuff is not comfortable (rubs on hand). Will continue to monitor exercises and hand function.     Follow Up Recommendations  Outpatient OT    Barriers to Discharge       Equipment Recommendations  None recommended by OT    Recommendations for Other Services    Frequency Min 2X/week   Plan Discharge plan remains appropriate    Precautions / Restrictions Precautions Precaution Comments: Absent sensation for light touch and pain (pinch) Bil UEs elbows and distally   Pertinent Vitals/Pain      ADL  ADL Comments: Pt reports he doesnt like the R forearm based ucuff as it was rubbing into his hand and causing a reddened place even with the moleskin that was applied yesterday. Pt reports it does help the pain to have utensil in cuff rather than trying to hold utensil to feed. The main concern for him is the comfort/tolerance of the ucuff and he states he prefers the black pre-fabricated black padded wrist cock up splints. Will try to see if those splints can be adapted to accomodate a utensil so he doesnt have to grip utensils with digits which causes pain.     OT Diagnosis:    OT Problem List:   OT Treatment Interventions:     OT Goals ADL Goals ADL Goal: Additional Goal #1 - Progress: Progressing toward goals Arm Goals Arm Goal: Additional Goal #1 - Progress: Progressing toward goals  Visit Information  Last OT Received On: 03/02/12    Subjective Data  Subjective: I dont like the other brace Patient Stated Goal: agreeable to work with OT   Prior Functioning     Cognition  Overall Cognitive Status: Appears within functional limits for tasks assessed/performed Arousal/Alertness: Awake/alert Orientation Level: Appears intact for tasks assessed Behavior During Session: [facility] for tasks performed    Mobility  Shoulder Instructions         Exercises  Hand Exercises Digit Composite Abduction: AROM;Strengthening;Both;Seated;Other (comment) (hand on table or lap, working on disassociation) Digit Composite Adduction: AROM;Strengthening;Both;Seated Digit Lifts: AROM;Strengthening;Both;Seated;Other (comment) (hands on table or lap, working on disassociation) Other Exercises Other Exercises: Pt continues to not have any AROM wrist extension with or without gravity bilaterally. Worked on bilateral finger abduction/adduction exercises and he continues to have to focus on the thumb more. He can better abduct thumb and 5th digit on L hand compared to R. Worked on gravity decreased wrist extension exercise but pt not able to demonstrate any extension. Also worked on finger lifts with forearms supported on table. Also had pt sit on window bench and work on some weight bearing/wrist extension stretch with pt placing hands on bench on each side of him and shifting weight over hands to complete partial standing. He stated he had sharp pain in bicep area of L UE with trying to shift weight on hand. So tried having pt use legs to help push up more (not as much through arms) and also only complete weight shift of pevis and not stand but still giving input through wrists. Pt stated no pain with this technique.    Balance  End of Session OT - End of Session Activity Tolerance: Patient tolerated treatment well Patient left: Other (comment) (at window seat. pt mobile to move around room )  GO     Lennox Laity [phone] 03/02/2012, 12:48 PM

## 2012-03-02 NOTE — Progress Notes (Signed)
VSS,afebrile.  Pt d/c to home.  Orders for f/u with [facility], they will call him with appointment.  Instructed pt if no one calls within the week, call the office, gave pt phone # for the neurology office.  Pt also ordered to do outpatient OT, gave pt prescription from MD for therapy.  Gave pt address and phone # for [facility].  Informed him he would have to call and set that up and give them the script for the therapy.  Pt voiced understanding for these instructions.  Iv site d/c and pressure dressing applied to site.  Pt waiting on ride to come at this time.

## 2012-03-03 LAB — LEAD, BLOOD: Lead-Whole Blood: <1 ug/dL (ref <25.0)

## 2012-03-05 LAB — CSF CULTURE W GRAM STAIN
Culture: NO GROWTH
Gram Stain: NONE SEEN

## 2012-03-06 LAB — CAROTENE, SERUM: Carotene, Total-Serum: 3 ug/dL — ABNORMAL LOW (ref 4–51)

## 2012-10-30 ENCOUNTER — Emergency Department (HOSPITAL_COMMUNITY): Payer: Medicaid Other

## 2012-10-30 ENCOUNTER — Emergency Department (HOSPITAL_COMMUNITY)
Admission: EM | Admit: 2012-10-30 | Discharge: 2012-10-30 | Disposition: A | Discharge status: Home / Self Care | Payer: Medicaid Other | Attending: Emergency Medicine | Admitting: Emergency Medicine

## 2012-10-30 ENCOUNTER — Encounter (HOSPITAL_COMMUNITY): Payer: Self-pay | Admitting: Emergency Medicine

## 2012-10-30 DIAGNOSIS — K922 Gastrointestinal hemorrhage, unspecified: Secondary | ICD-10-CM

## 2012-10-30 DIAGNOSIS — K921 Melena: Secondary | ICD-10-CM | POA: Insufficient documentation

## 2012-10-30 DIAGNOSIS — R197 Diarrhea, unspecified: Secondary | ICD-10-CM | POA: Insufficient documentation

## 2012-10-30 DIAGNOSIS — K2920 Alcoholic gastritis without bleeding: Secondary | ICD-10-CM

## 2012-10-30 DIAGNOSIS — K644 Residual hemorrhoidal skin tags: Secondary | ICD-10-CM | POA: Insufficient documentation

## 2012-10-30 DIAGNOSIS — F17200 Nicotine dependence, unspecified, uncomplicated: Secondary | ICD-10-CM | POA: Insufficient documentation

## 2012-10-30 DIAGNOSIS — K2921 Alcoholic gastritis with bleeding: Secondary | ICD-10-CM | POA: Insufficient documentation

## 2012-10-30 DIAGNOSIS — R112 Nausea with vomiting, unspecified: Secondary | ICD-10-CM | POA: Insufficient documentation

## 2012-10-30 LAB — CBC
MCH: 33.3 pg (ref 26.0–34.0)
Platelets: 284 10*3/uL (ref 150–400)
RBC: 4.44 MIL/uL (ref 4.22–5.81)
WBC: 7.3 10*3/uL (ref 4.0–10.5)

## 2012-10-30 LAB — COMPREHENSIVE METABOLIC PANEL
ALT: 45 U/L (ref 0–53)
AST: 89 U/L — ABNORMAL HIGH (ref 0–37)
CO2: 23 mEq/L (ref 19–32)
Calcium: 9 mg/dL (ref 8.4–10.5)
GFR calc non Af Amer: >90 mL/min (ref >90)
Sodium: 134 mEq/L — ABNORMAL LOW (ref 135–145)

## 2012-10-30 MED ORDER — ONDANSETRON HCL 4 MG PO TABS: 4.0000 mg | ORAL_TABLET | Freq: Four times a day (QID) | ORAL | Status: DC

## 2012-10-30 MED ORDER — FAMOTIDINE 20 MG PO TABS: 20.0000 mg | ORAL_TABLET | Freq: Two times a day (BID) | ORAL | Status: DC

## 2012-10-30 MED ORDER — KETOROLAC TROMETHAMINE 30 MG/ML IJ SOLN
30.0000 mg | Freq: Once | INTRAMUSCULAR | Status: AC
  Administered 2012-10-30: 30 mg via INTRAVENOUS
  Filled 2012-10-30: qty 1

## 2012-10-30 MED ORDER — SODIUM CHLORIDE 0.9 % IV BOLUS (SEPSIS)
1000.0000 mL | Freq: Once | INTRAVENOUS | Status: AC
  Administered 2012-10-30: 1000 mL via INTRAVENOUS

## 2012-10-30 MED ORDER — ONDANSETRON 4 MG PO TBDP
4.0000 mg | ORAL_TABLET | Freq: Once | ORAL | Status: AC
  Administered 2012-10-30: 4 mg via ORAL
  Filled 2012-10-30: qty 1

## 2012-10-30 MED ORDER — PANTOPRAZOLE SODIUM 40 MG IV SOLR
40.0000 mg | Freq: Once | INTRAVENOUS | Status: AC
  Administered 2012-10-30: 40 mg via INTRAVENOUS
  Filled 2012-10-30: qty 40

## 2012-10-30 NOTE — ED Notes (Signed)
Pt tolerated fluid challenge well. Pt denies nausea with drinking 240 ml of ginger ale with crackers. Pt continues to report 9/10 abdominal pain.

## 2012-10-30 NOTE — ED Provider Notes (Signed)
History     CSN: [id number]  Arrival date & time 10/30/12  1114   First MD Initiated Contact with Patient 10/30/12 1126      Chief Complaint  Patient presents with  . Abdominal Pain    (Consider location/radiation/quality/duration/timing/severity/associated sxs/prior treatment) HPI Comments: 32 year old male presents to the emergency department complaining of sudden onset right upper quadrant and mid epigastric abdominal pain x2 days. Pain described as sharp and stabbing, constant, nonradiating rated 9/10. Nothing in specific makes the pain worse. Pain unrelieved by rest. He has not tried any other alleviating factors. Admits to associated nausea and vomiting. Last episode of vomiting was about 1 hour ago. He has been unable to keep any food down for the past 2 days. Admits to drinking alcohol daily, last drink being 2 days ago. Also admits to a diet consisting spicy and fatty fried food. One episode of diarrhea yesterday, had a bowel movement this morning with a "good amount" of dark red blood. Denies history of bloody stool. Denies fever, weakness, chest pain or shortness of breath. No sick contacts.  Patient is a 33 y.o. male presenting with abdominal pain. The history is provided by the patient.  Abdominal Pain Associated symptoms include abdominal pain, nausea and vomiting. Pertinent negatives include no chest pain, fatigue, fever or weakness.    History reviewed. No pertinent past medical history.  Past Surgical History  Procedure Laterality Date  . Forearm surgery      Family History  Problem Relation Age of Onset  . Stroke      History  Substance Use Topics  . Smoking status: Current Every Day Smoker -- 0.50 packs/day for 15 years    Types: Cigarettes  . Smokeless tobacco: Never Used  . Alcohol Use: Yes     Comment: occasion      Review of Systems  Constitutional: Positive for appetite change. Negative for fever and fatigue.  Respiratory: Negative for shortness  of breath.   Cardiovascular: Negative for chest pain.  Gastrointestinal: Positive for nausea, vomiting, abdominal pain, diarrhea and blood in stool.  Genitourinary: Negative for frequency, hematuria and flank pain.  Musculoskeletal: Negative for back pain.  Neurological: Negative for dizziness, weakness and light-headedness.  Psychiatric/Behavioral: Negative for confusion.  All other systems reviewed and are negative.    Allergies  Review of patient's allergies indicates no known allergies.  Home Medications  No current outpatient prescriptions on file.  BP 148/88  Pulse 72  Temp(Src) 97.6 F (36.4 C) (Oral)  Resp 16  SpO2 100%  Physical Exam  Nursing note and vitals reviewed. Constitutional: He is oriented to person, place, and time. He appears well-developed and well-nourished. No distress.  HENT:  Head: Normocephalic and atraumatic.  Mouth/Throat: Oropharynx is clear and moist.  Eyes: Conjunctivae are normal. No scleral icterus.  Neck: Normal range of motion. Neck supple.  Cardiovascular: Normal rate, regular rhythm and normal heart sounds.   Pulmonary/Chest: Effort normal and breath sounds normal. No respiratory distress. He has no wheezes.  Abdominal: Soft. Normal appearance and bowel sounds are normal. He exhibits no distension and no mass. There is tenderness in the right upper quadrant and epigastric area. There is positive Murphy's sign. There is no rigidity, no rebound, no guarding and no CVA tenderness.  Genitourinary: Rectal exam shows external hemorrhoid (no thrombosis or hemorrhage). Rectal exam shows no internal hemorrhoid, no fissure, no mass, no tenderness and anal tone normal. Guaiac positive stool.  Small amount of BRB mixed with normal stool color  on exam glove.  Musculoskeletal: Normal range of motion. He exhibits no edema.  Neurological: He is alert and oriented to person, place, and time.  Skin: Skin is warm and dry. He is not diaphoretic.  Psychiatric:  He has a normal mood and affect. His behavior is normal.    ED Course  Procedures (including critical care time)  Labs Reviewed  COMPREHENSIVE METABOLIC PANEL - Abnormal; Notable for the following:    Sodium 134 (*)    Glucose, Bld 53 (*)    AST 89 (*)    All other components within normal limits  OCCULT BLOOD, POC DEVICE - Abnormal; Notable for the following:    Fecal Occult Bld POSITIVE (*)    All other components within normal limits  CBC  LIPASE, BLOOD   US Abdomen Complete  10/30/2012   *RADIOLOGY REPORT*  Clinical Data:  Right upper quadrant abdominal pain.  COMPLETE ABDOMINAL ULTRASOUND  Comparison:  None.  Findings:  Gallbladder:  No shadowing gallstones or echogenic sludge.  No gallbladder wall thickening or pericholecystic fluid.  Negative sonographic Murphy's sign according to the ultrasound technologist.  Common bile duct:  Normal in caliber with maximum diameter approximating 2 mm.  Liver:  No focal mass lesion seen.  Within normal limits in parenchymal echogenicity.  IVC:  Patent.  Pancreas:  Although the pancreas is difficult to visualize in its entirety, no focal pancreatic abnormality is identified.  Spleen:  Normal size and echotexture without focal parenchymal abnormality.  Right Kidney:  No hydronephrosis.  Well-preserved cortex.  No shadowing calculi.  Normal size and parenchymal echotexture without focal abnormalities.  Approximately 11.1 cm in length.  Left Kidney:  No hydronephrosis.  Well-preserved cortex.  No shadowing calculi.  Normal size and parenchymal echotexture without focal abnormalities.  Approximately 10.8 cm in length.  Abdominal aorta:  Normal in caliber throughout its visualized course in the abdomen without significant atherosclerosis.  IMPRESSION: Normal examination.   Original Report Authenticated By: Hulan Saas, M.D.     1. Alcoholic gastritis   2. GI bleed       MDM  32 y/o male with RUQ/mid-epigastric abdominal pain. Positive Murphy's  sign. Alcohol gastritis  vs gallbladder pathology. Obtaining labs including CBC, CMP, lipase, U/A as well as abdominal ultrasound. Stool occult blood positive. Hemodynamically stable. Fluids, toradol for pain, zofran for nausea, protonix. 2:05 PM Abdominal US unremarkable. Labs unremarkable other than elevated AST at 89. Diagnosis most likely alcoholic gastritis with gastric ulcer. I spoke with Dr. Bosie Clos with [facility] to see if he could have patient come into office this week for f/u due to GI bleed who will have office fit patient in with whoever has an opening. Giving PO challenge and will re-evaluate.  2:47 PM Patient tolerated PO challenge with fluids and crackers. Repeat abdominal exam with soft abdomen, clinical improvement of RUQ and epigastric tenderness, however still slightly present. Stable for discharge. Rx zofran, pepcid. Aware to f/u with GI. Discussed dietary changes, advised against alcohol. Return precautions discussed. Patient states understanding of plan and is agreeable. Case discussed with Dr. Ethelda Chick who also evaluated patient and agrees with plan of care.  Trevor Mace, PA-C 10/30/12 1450

## 2012-10-30 NOTE — ED Provider Notes (Signed)
Complaint of epigastric pain constant for 2 days after a drinking binge. Pain is constant he had multiple episodes of vomiting since the pain began. Pain is mild at present and nonradiating.  Doug Sou, MD 10/30/12 [phone]

## 2012-10-30 NOTE — ED Notes (Signed)
Pt reports 9/10 bilateral upper abdominal pain that started two days ago. Pt reports having a bowl movement this morning and noticed blood in his stool. Pt reports nausea, vomiting, and diarrhea.

## 2012-10-30 NOTE — ED Provider Notes (Signed)
Medical screening examination/treatment/procedure(s) were conducted as a shared visit with non-physician practitioner(s) and myself.  I personally evaluated the patient during the encounter  Clevie Prout, MD 10/30/12 1707 

## 2012-11-03 ENCOUNTER — Other Ambulatory Visit: Payer: Self-pay | Admitting: Gastroenterology

## 2012-11-03 DIAGNOSIS — R109 Unspecified abdominal pain: Secondary | ICD-10-CM

## 2012-11-09 ENCOUNTER — Ambulatory Visit
Admission: RE | Admit: 2012-11-09 | Discharge: 2012-11-09 | Disposition: A | Discharge status: Home / Self Care | Payer: Medicaid Other | Source: Ambulatory Visit | Attending: Gastroenterology | Admitting: Gastroenterology

## 2012-11-09 DIAGNOSIS — R109 Unspecified abdominal pain: Secondary | ICD-10-CM

## 2012-11-09 MED ORDER — IOHEXOL 300 MG/ML  SOLN
100.0000 mL | Freq: Once | INTRAMUSCULAR | Status: AC | PRN
  Administered 2012-11-09: 100 mL via INTRAVENOUS

## 2013-01-01 ENCOUNTER — Encounter (HOSPITAL_COMMUNITY): Payer: Self-pay | Admitting: Emergency Medicine

## 2013-01-01 DIAGNOSIS — Z79899 Other long term (current) drug therapy: Secondary | ICD-10-CM | POA: Insufficient documentation

## 2013-01-01 DIAGNOSIS — R197 Diarrhea, unspecified: Secondary | ICD-10-CM | POA: Insufficient documentation

## 2013-01-01 DIAGNOSIS — R112 Nausea with vomiting, unspecified: Secondary | ICD-10-CM | POA: Insufficient documentation

## 2013-01-01 DIAGNOSIS — Z8719 Personal history of other diseases of the digestive system: Secondary | ICD-10-CM | POA: Insufficient documentation

## 2013-01-01 DIAGNOSIS — R141 Gas pain: Secondary | ICD-10-CM | POA: Insufficient documentation

## 2013-01-01 DIAGNOSIS — F17200 Nicotine dependence, unspecified, uncomplicated: Secondary | ICD-10-CM | POA: Insufficient documentation

## 2013-01-01 DIAGNOSIS — F1010 Alcohol abuse, uncomplicated: Secondary | ICD-10-CM | POA: Insufficient documentation

## 2013-01-01 DIAGNOSIS — R142 Eructation: Secondary | ICD-10-CM | POA: Insufficient documentation

## 2013-01-01 LAB — URINALYSIS, ROUTINE W REFLEX MICROSCOPIC
Bilirubin Urine: NEGATIVE
Hgb urine dipstick: NEGATIVE
Nitrite: NEGATIVE
Specific Gravity, Urine: 1.008 (ref 1.005–1.030)
Urobilinogen, UA: 0.2 mg/dL (ref 0.0–1.0)
pH: 6.5 (ref 5.0–8.0)

## 2013-01-01 LAB — COMPREHENSIVE METABOLIC PANEL
ALT: 12 U/L (ref 0–53)
Alkaline Phosphatase: 90 U/L (ref 39–117)
BUN: 13 mg/dL (ref 6–23)
CO2: 25 mEq/L (ref 19–32)
Chloride: 101 mEq/L (ref 96–112)
GFR calc Af Amer: >90 mL/min (ref >90)
GFR calc non Af Amer: >90 mL/min (ref >90)
Glucose, Bld: 91 mg/dL (ref 70–99)
Potassium: 3.9 mEq/L (ref 3.5–5.1)
Sodium: 134 mEq/L — ABNORMAL LOW (ref 135–145)
Total Bilirubin: 0.5 mg/dL (ref 0.3–1.2)

## 2013-01-01 LAB — CBC WITH DIFFERENTIAL/PLATELET
Hemoglobin: 15 g/dL (ref 13.0–17.0)
Lymphocytes Relative: 15 % (ref 12–46)
Lymphs Abs: 1.3 10*3/uL (ref 0.7–4.0)
Monocytes Relative: 7 % (ref 3–12)
Neutrophils Relative %: 76 % (ref 43–77)
Platelets: 274 10*3/uL (ref 150–400)
RBC: 4.43 MIL/uL (ref 4.22–5.81)
WBC: 8.6 10*3/uL (ref 4.0–10.5)

## 2013-01-01 NOTE — ED Notes (Signed)
PT. REPORTS INTERMITTENT GENERALIZED ABDOMINAL PAIN WITH NAUSEA , VOMITTING AND DIARRHEA ONSET YESTERDAY , DENIES FEVER OR CHILLS.

## 2013-01-02 ENCOUNTER — Emergency Department (HOSPITAL_COMMUNITY)
Admission: EM | Admit: 2013-01-02 | Discharge: 2013-01-02 | Disposition: A | Discharge status: Home / Self Care | Payer: Medicaid Other | Attending: Emergency Medicine | Admitting: Emergency Medicine

## 2013-01-02 DIAGNOSIS — R112 Nausea with vomiting, unspecified: Secondary | ICD-10-CM

## 2013-01-02 HISTORY — DX: Alcohol abuse, uncomplicated: F10.10

## 2013-01-02 HISTORY — DX: Alcoholic gastritis without bleeding: K29.20

## 2013-01-02 MED ORDER — MORPHINE SULFATE 4 MG/ML IJ SOLN
4.0000 mg | Freq: Once | INTRAMUSCULAR | Status: AC
  Administered 2013-01-02: 4 mg via INTRAVENOUS
  Filled 2013-01-02: qty 1

## 2013-01-02 MED ORDER — PROMETHAZINE HCL 25 MG PO TABS: 25.0000 mg | ORAL_TABLET | Freq: Four times a day (QID) | ORAL | Status: DC | PRN

## 2013-01-02 MED ORDER — ONDANSETRON HCL 4 MG/2ML IJ SOLN
4.0000 mg | Freq: Once | INTRAMUSCULAR | Status: AC
  Administered 2013-01-02: 4 mg via INTRAVENOUS
  Filled 2013-01-02: qty 2

## 2013-01-02 MED ORDER — SODIUM CHLORIDE 0.9 % IV BOLUS (SEPSIS)
1000.0000 mL | Freq: Once | INTRAVENOUS | Status: AC
  Administered 2013-01-02: 1000 mL via INTRAVENOUS

## 2013-01-02 NOTE — ED Notes (Signed)
Pt able to tolerate ginger ale with no N/V.

## 2013-01-02 NOTE — ED Notes (Signed)
Pt given cup of ginger ale for fluid challenge. Pt able to tolerate okay, no N/V at the moment. Will re-assess in 15 minutes.

## 2013-01-02 NOTE — ED Provider Notes (Signed)
CSN: [id number]     Arrival date & time 01/01/13  2148 History     First MD Initiated Contact with Patient 01/02/13 [phone]     Chief Complaint  Patient presents with  . Abdominal Pain   (Consider location/radiation/quality/duration/timing/severity/associated sxs/prior Treatment) HPI Comments: Patient reports onset of nausea, vomiting, and diarrhea.  Yesterday.  It has been persistent throughout the day.  He said his similar episode like this 3-4 months ago.  He doesn't have any other medical problems, that he is aware of.  Patient is a 32 y.o. male presenting with abdominal pain. The history is provided by the patient.  Abdominal Pain This is a new problem. The current episode started yesterday. The problem occurs intermittently. The problem has been unchanged. Associated symptoms include abdominal pain, a change in bowel habit, nausea and vomiting. Pertinent negatives include no chest pain, chills or fever. Nothing aggravates the symptoms. He has tried nothing for the symptoms. The treatment provided no relief.    Past Medical History  Diagnosis Date  . Alcoholic gastritis   . Alcohol abuse    Past Surgical History  Procedure Laterality Date  . Forearm surgery     Family History  Problem Relation Age of Onset  . Stroke     History  Substance Use Topics  . Smoking status: Current Every Day Smoker -- 0.50 packs/day for 15 years    Types: Cigarettes  . Smokeless tobacco: Never Used  . Alcohol Use: Yes     Comment: occasion    Review of Systems  Constitutional: Negative for fever and chills.  Cardiovascular: Negative for chest pain.  Gastrointestinal: Positive for nausea, vomiting, abdominal pain, diarrhea and change in bowel habit.  Genitourinary: Negative for dysuria.  All other systems reviewed and are negative.    Allergies  Review of patient's allergies indicates no known allergies.  Home Medications   Current Outpatient Rx  Name  Route  Sig  Dispense  Refill   . famotidine (PEPCID) 20 MG tablet   Oral   Take 1 tablet (20 mg total) by mouth 2 (two) times daily.   30 tablet   0   . promethazine (PHENERGAN) 25 MG tablet   Oral   Take 1 tablet (25 mg total) by mouth every 6 (six) hours as needed for nausea.   20 tablet   0    BP 107/73  Pulse 68  Temp(Src) 98.4 F (36.9 C) (Oral)  Resp 14  Ht 5\' 9"  (1.753 m)  Wt 137 lb (62.143 kg)  BMI 20.22 kg/m2  SpO2 96% Physical Exam  Nursing note and vitals reviewed. Constitutional: He is oriented to person, place, and time. He appears well-developed and well-nourished.  Neck: Normal range of motion.  Cardiovascular: Normal rate.   Pulmonary/Chest: Effort normal and breath sounds normal.  Abdominal: Soft. Bowel sounds are normal. He exhibits distension. There is generalized tenderness.  Musculoskeletal: Normal range of motion.  Neurological: He is alert and oriented to person, place, and time.  Skin: Skin is warm.    ED Course   Procedures (including critical care time)  Labs Reviewed  CBC WITH DIFFERENTIAL - Abnormal; Notable for the following:    MCHC 36.1 (*)    All other components within normal limits  COMPREHENSIVE METABOLIC PANEL - Abnormal; Notable for the following:    Sodium 134 (*)    All other components within normal limits  URINALYSIS, ROUTINE W REFLEX MICROSCOPIC  LIPASE, BLOOD   No results found. 1.  Nausea vomiting and diarrhea     MDM  Patient is feeling, better.  Labs are within normal limits.  He has had no further episodes of vomiting, or diarrhea.  He, states he still has mild nausea, but is able to tolerate a fluid challenge.  He will be discharged him with a prescription for Phenergan, and instructions on the correct diet   Arman Filter, NP 01/02/13 0525  Arman Filter, NP 01/02/13 [phone]

## 2013-01-03 NOTE — ED Provider Notes (Signed)
Medical screening examination/treatment/procedure(s) were performed by non-physician practitioner and as supervising physician I was immediately available for consultation/collaboration.  Tarea Skillman, MD 01/03/13 0447 

## 2013-04-16 ENCOUNTER — Emergency Department (HOSPITAL_COMMUNITY)
Admission: EM | Admit: 2013-04-16 | Discharge: 2013-04-16 | Disposition: A | Discharge status: Home / Self Care | Payer: Medicaid Other | Attending: Emergency Medicine | Admitting: Emergency Medicine

## 2013-04-16 ENCOUNTER — Encounter (HOSPITAL_COMMUNITY): Payer: Self-pay | Admitting: Emergency Medicine

## 2013-04-16 ENCOUNTER — Emergency Department (HOSPITAL_COMMUNITY): Payer: Medicaid Other

## 2013-04-16 DIAGNOSIS — K625 Hemorrhage of anus and rectum: Secondary | ICD-10-CM | POA: Insufficient documentation

## 2013-04-16 DIAGNOSIS — F17200 Nicotine dependence, unspecified, uncomplicated: Secondary | ICD-10-CM | POA: Insufficient documentation

## 2013-04-16 DIAGNOSIS — R0789 Other chest pain: Secondary | ICD-10-CM

## 2013-04-16 DIAGNOSIS — R071 Chest pain on breathing: Secondary | ICD-10-CM | POA: Insufficient documentation

## 2013-04-16 LAB — CBC
Hemoglobin: 14.7 g/dL (ref 13.0–17.0)
MCH: 33.3 pg (ref 26.0–34.0)
MCHC: 35.3 g/dL (ref 30.0–36.0)
Platelets: 296 10*3/uL (ref 150–400)
RDW: 13.7 % (ref 11.5–15.5)

## 2013-04-16 LAB — POCT I-STAT TROPONIN I

## 2013-04-16 LAB — BASIC METABOLIC PANEL
Calcium: 9.2 mg/dL (ref 8.4–10.5)
GFR calc Af Amer: >90 mL/min (ref >90)
GFR calc non Af Amer: >90 mL/min (ref >90)
Glucose, Bld: 74 mg/dL (ref 70–99)
Sodium: 139 mEq/L (ref 135–145)

## 2013-04-16 MED ORDER — HYDROCODONE-ACETAMINOPHEN 5-325 MG PO TABS: 1.0000 | ORAL_TABLET | Freq: Four times a day (QID) | ORAL | Status: DC | PRN

## 2013-04-16 MED ORDER — OXYCODONE-ACETAMINOPHEN 5-325 MG PO TABS
1.0000 | ORAL_TABLET | Freq: Once | ORAL | Status: AC
  Administered 2013-04-16: 1 via ORAL
  Filled 2013-04-16: qty 1

## 2013-04-16 MED ORDER — IBUPROFEN 800 MG PO TABS: 800.0000 mg | ORAL_TABLET | Freq: Three times a day (TID) | ORAL | Status: DC | PRN

## 2013-04-16 NOTE — ED Provider Notes (Signed)
CSN: [id number]     Arrival date & time 04/16/13  1159 History   First MD Initiated Contact with Patient 04/16/13 1710     Chief Complaint  Patient presents with  . Chest Pain  . Rectal Bleeding   (Consider location/radiation/quality/duration/timing/severity/associated sxs/prior Treatment) HPI Patient presents emergency department with left-sided chest pain, that started 3 days, ago.  Patient, states, that he started having pain, while lifting some items in a closet at home.  Patient, states the pain is worse with movement and palpation.  Patient denies shortness breath, nausea, vomiting, abdominal pain, headache, blurred vision, weakness, numbness, dizziness back pain, or syncope.  Patient, states he did not take any medications prior to arrival. Past Medical History  Diagnosis Date  . Alcoholic gastritis   . Alcohol abuse    Past Surgical History  Procedure Laterality Date  . Forearm surgery     Family History  Problem Relation Age of Onset  . Stroke     History  Substance Use Topics  . Smoking status: Current Every Day Smoker -- 0.50 packs/day for 15 years    Types: Cigarettes  . Smokeless tobacco: Never Used  . Alcohol Use: Yes     Comment: occasion    Review of Systems All other systems negative except as documented in the HPI. All pertinent positives and negatives as reviewed in the HPI. Allergies  Review of patient's allergies indicates no known allergies.  Home Medications  No current outpatient prescriptions on file. BP 125/79  Pulse 79  Temp(Src) 97.9 F (36.6 C) (Oral)  Resp 20  Wt 132 lb 7 oz (60.073 kg)  SpO2 100% Physical Exam  Nursing note and vitals reviewed. Constitutional: He is oriented to person, place, and time. He appears well-developed and well-nourished. No distress.  HENT:  Head: Normocephalic and atraumatic.  Eyes: Pupils are equal, round, and reactive to light.  Neck: Normal range of motion. Neck supple.  Cardiovascular: Normal rate,  regular rhythm and normal heart sounds.  Exam reveals no gallop and no friction rub.   No murmur heard. Pulmonary/Chest: Effort normal and breath sounds normal. No respiratory distress. He exhibits tenderness.  Abdominal: Soft. Bowel sounds are normal. He exhibits no distension. There is no tenderness.  Neurological: He is alert and oriented to person, place, and time. He exhibits normal muscle tone. Coordination normal.  Skin: Skin is warm and dry. No rash noted. No erythema.    ED Course  Procedures (including critical care time) Labs Review Labs Reviewed  CBC  BASIC METABOLIC PANEL  POCT I-STAT TROPONIN I   Imaging Review Dg Chest 2 View  04/16/2013   CLINICAL DATA:  Chest pain  EXAM: CHEST  2 VIEW  COMPARISON:  None.  FINDINGS: Two view exam shows no focal airspace consolidation or pulmonary edema. No pleural effusion. 5 mm nodular density projects over the right midlung. Ill-defined opacity is seen in the right apex. The cardiopericardial silhouette is within normal limits for size. Imaged bony structures of the thorax are intact.  IMPRESSION: 2 x 4 cm area of ill-defined density in the right apex associated with a 5 mm nodular density over the right mid lung. The nodule may represent a nipple shadow. The apical density is indeterminate. CT chest without contrast could be used to further evaluate.   Electronically Signed   By: Kennith Center M.D.   On: 04/16/2013 13:00   Ct Chest Wo Contrast  04/16/2013   CLINICAL DATA:  Left-sided has pain.  EXAM:  CT CHEST WITHOUT CONTRAST  TECHNIQUE: Multidetector CT imaging of the chest was performed following the standard protocol without IV contrast.  COMPARISON:  Chest x-ray earlier today.  FINDINGS: No suspicious nodular densities in the lungs. In particular, within the right apex or right midlung. There are linear densities posteriorly in the lungs, likely atelectasis. No pleural effusions. Heart is normal size. Aorta is normal caliber. No  mediastinal, hilar, or axillary adenopathy. Chest wall soft tissues are unremarkable. Imaging into the upper abdomen shows no acute findings.  IMPRESSION: No suspicious nodules within the lungs. Dependent atelectasis in the lungs bilaterally. No acute findings.   Electronically Signed   By: Charlett Nose M.D.   On: 04/16/2013 19:33   Patient's chest pain, seems atypical for cardiac, chest pain, based on the fact that pain  increases with movement and palpation.  Patient had a CT scan done due to the fact that he abnormality noted on plain films of his chest.  Patient noticed the pain started when he was doing some overhead lifting at home.  Patient didn't take any medications prior to arrival      Carlyle Dolly, [place] 04/17/13 0148

## 2013-04-16 NOTE — ED Notes (Signed)
Pt also reports blood in his stool for about one week

## 2013-04-16 NOTE — ED Notes (Signed)
Pt is here with 2 day history of left chest area pain, no radiation, increasing when coughs

## 2013-04-19 NOTE — ED Provider Notes (Signed)
Medical screening examination/treatment/procedure(s) were performed by non-physician practitioner and as supervising physician I was immediately available for consultation/collaboration.  EKG Interpretation     Ventricular Rate:  86 PR Interval:  126 QRS Duration: 86 QT Interval:  364 QTC Calculation: 435 R Axis:   74 Text Interpretation:  Normal sinus rhythm Biatrial enlargement Nonspecific ST and T wave abnormality             Harrold Donath R. Rubin Payor, MD 04/19/13 [phone]

## 2013-11-13 ENCOUNTER — Emergency Department (HOSPITAL_COMMUNITY)
Admission: EM | Admit: 2013-11-13 | Discharge: 2013-11-13 | Disposition: A | Discharge status: Home / Self Care | Payer: Medicaid Other | Attending: Emergency Medicine | Admitting: Emergency Medicine

## 2013-11-13 ENCOUNTER — Encounter (HOSPITAL_COMMUNITY): Payer: Self-pay | Admitting: Emergency Medicine

## 2013-11-13 DIAGNOSIS — H5710 Ocular pain, unspecified eye: Secondary | ICD-10-CM | POA: Insufficient documentation

## 2013-11-13 DIAGNOSIS — H53149 Visual discomfort, unspecified: Secondary | ICD-10-CM | POA: Insufficient documentation

## 2013-11-13 DIAGNOSIS — F1010 Alcohol abuse, uncomplicated: Secondary | ICD-10-CM | POA: Insufficient documentation

## 2013-11-13 DIAGNOSIS — K2920 Alcoholic gastritis without bleeding: Secondary | ICD-10-CM | POA: Insufficient documentation

## 2013-11-13 DIAGNOSIS — H5789 Other specified disorders of eye and adnexa: Secondary | ICD-10-CM | POA: Insufficient documentation

## 2013-11-13 DIAGNOSIS — F17200 Nicotine dependence, unspecified, uncomplicated: Secondary | ICD-10-CM | POA: Insufficient documentation

## 2013-11-13 DIAGNOSIS — H18419 Arcus senilis, unspecified eye: Secondary | ICD-10-CM | POA: Insufficient documentation

## 2013-11-13 MED ORDER — FLUORESCEIN SODIUM 1 MG OP STRP
1.0000 | ORAL_STRIP | Freq: Once | OPHTHALMIC | Status: AC
  Administered 2013-11-13: 1 via OPHTHALMIC
  Filled 2013-11-13: qty 1

## 2013-11-13 MED ORDER — TETRACAINE HCL 0.5 % OP SOLN
2.0000 [drp] | Freq: Once | OPHTHALMIC | Status: AC
  Administered 2013-11-13: 2 [drp] via OPHTHALMIC
  Filled 2013-11-13: qty 2

## 2013-11-13 MED ORDER — ERYTHROMYCIN 5 MG/GM OP OINT
TOPICAL_OINTMENT | Freq: Four times a day (QID) | OPHTHALMIC | Status: DC
  Administered 2013-11-13: 15:00:00 via OPHTHALMIC
  Filled 2013-11-13: qty 1

## 2013-11-13 NOTE — ED Notes (Signed)
Visual Acuity Exam performed. Left and right eye vision 20/20.

## 2013-11-13 NOTE — Discharge Instructions (Signed)
FOLLOW UP WITH DR. BEVIS IF NO BETTER IN 48 HOURS. RETURN HERE AS NEEDED.

## 2013-11-13 NOTE — ED Notes (Signed)
Rt. Eye is red, puffy, tearing,  Started on Sunday.  Very painful

## 2013-11-13 NOTE — ED Provider Notes (Signed)
CSN: [id number]     Arrival date & time 11/13/13  1326 History  This chart was scribed for non-physician practitioner, Elpidio Anis, PA-C working with Suzi Roots, MD by Greggory Stallion, ED scribe. This patient was seen in room TR04C/TR04C and the patient's care was started at 1:51 PM.    Chief Complaint  Patient presents with  . Eye Pain   The history is provided by the patient. No language interpreter was used.   HPI Comments: Wesley Stewart is a 33 y.o. male who presents to the Emergency Department complaining of gradual onset right eye pain, redness and watery drainage that started 2 days ago. Pt is also having associated photophobia. He states it feels like something is in his eye but does not recall getting anything in it.   Past Medical History  Diagnosis Date  . Alcoholic gastritis   . Alcohol abuse    Past Surgical History  Procedure Laterality Date  . Forearm surgery     Family History  Problem Relation Age of Onset  . Stroke     History  Substance Use Topics  . Smoking status: Current Every Day Smoker -- 0.50 packs/day for 15 years    Types: Cigarettes  . Smokeless tobacco: Never Used  . Alcohol Use: Yes     Comment: occasion    Review of Systems  Eyes: Positive for photophobia, pain, discharge (watery) and redness.  All other systems reviewed and are negative.  Allergies  Review of patient's allergies indicates no known allergies.  Home Medications   Prior to Admission medications   Medication Sig Start Date End Date Taking? Authorizing Provider  HYDROcodone-acetaminophen (NORCO/VICODIN) 5-325 MG per tablet Take 1 tablet by mouth every 6 (six) hours as needed for moderate pain. 04/16/13   Jamesetta Orleans Lawyer, PA-C  ibuprofen (ADVIL,MOTRIN) 800 MG tablet Take 1 tablet (800 mg total) by mouth every 8 (eight) hours as needed. 04/16/13   Jamesetta Orleans Lawyer, PA-C   BP 132/80  Pulse 73  Temp(Src) 98.3 F (36.8 C) (Oral)  SpO2 100%  Physical Exam   Nursing note and vitals reviewed. Constitutional: He is oriented to person, place, and time. He appears well-developed and well-nourished. No distress.  HENT:  Head: Normocephalic and atraumatic.  Eyes: EOM are normal.  Excessive tearing from right eye. Red conjunctiva. Pupil is 2-3 mm and sluggish. Anisocoria present. Fluorescein uptake negative. No foreign bodies observed. No hyphema. Pain free full ROM.   Neck: Normal range of motion. No tracheal deviation present.  Cardiovascular: Normal rate.   Pulmonary/Chest: Effort normal. No respiratory distress.  Musculoskeletal: Normal range of motion.  Neurological: He is alert and oriented to person, place, and time.  Skin: Skin is warm and dry.  Psychiatric: He has a normal mood and affect. His behavior is normal.    ED Course  Procedures (including critical care time)  DIAGNOSTIC STUDIES: Oxygen Saturation is 100% on RA, normal by my interpretation.    COORDINATION OF CARE: 1:53 PM-Discussed treatment plan which includes checking for corneal abrasion with pt at bedside and pt agreed to plan.   Labs Review Labs Reviewed - No data to display  Imaging Review No results found.   EKG Interpretation None      MDM   Final diagnoses:  None    1. Right eye pain  Reddened conjunctiva, clear drainage. No abrasion, foreign body, obvious trauma. Suspect eye infection. Refer to optho prn.  I personally performed the services described in this  documentation, which was scribed in my presence. The recorded information has been reviewed and is accurate.  Arnoldo HookerShari A Mattilyn Crites, PA-C 11/13/13 1504

## 2013-11-14 NOTE — ED Provider Notes (Signed)
Medical screening examination/treatment/procedure(s) were performed by non-physician practitioner and as supervising physician I was immediately available for consultation/collaboration.    Suzi Roots, MD 11/14/13 [phone]

## 2014-03-31 IMAGING — CT CT CERVICAL SPINE W/O CM
5 series · 16 of 33 positions shown, 18 images · non-contrast
Comparison: None.

CLINICAL DATA: Bilateral hand numbness with wrist drop.

CT CERVICAL SPINE WITHOUT CONTRAST
TECHNIQUE: Multidetector CT imaging of the cervical spine was
performed. Multiplanar CT image reconstructions were also
generated.

[Series 2: cervical spine · axial · 0.33mm/px · z∈[-174,-74]mm · 3 of 82 slices shown]
[im 21/82  bone]
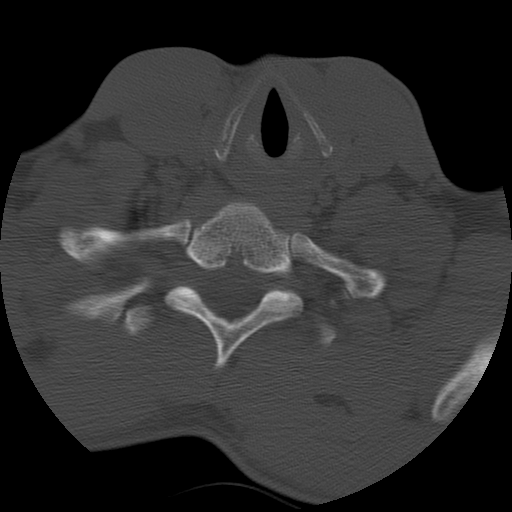
[im 41/82  bone]
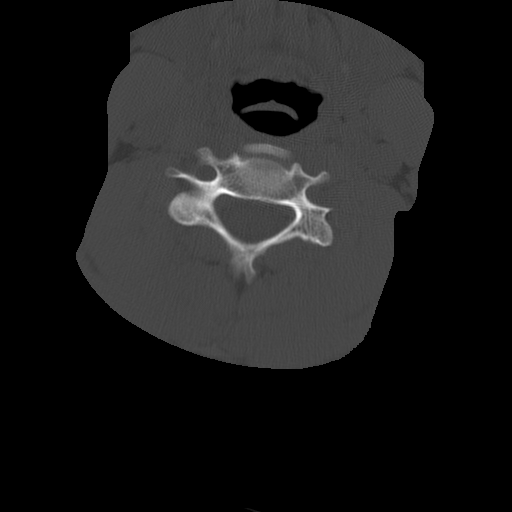
[im 61/82  bone]
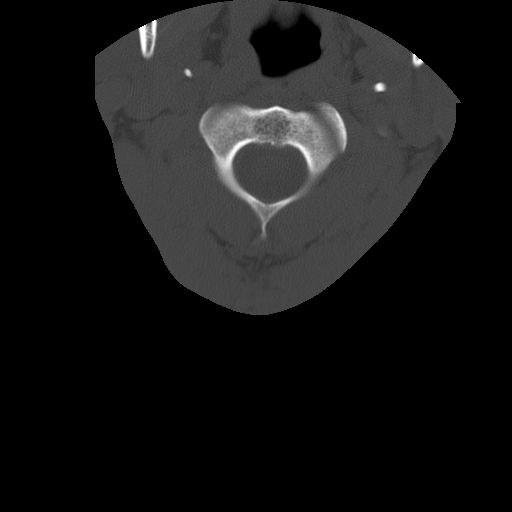

[Series 3: recon 2: cervical spine · axial · 0.33mm/px · z∈[-154,-89]mm · 2 of 80 slices shown]
[im 27/80  bone]
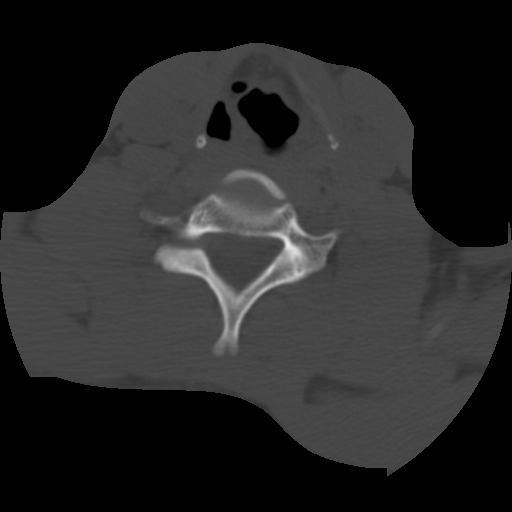
[im 53/80  bone]
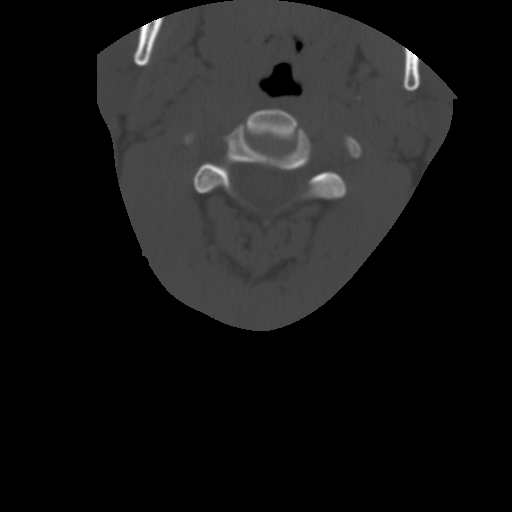

[Series 400: sag · sagittal · 0.41mm/px · 5 of 53 slices shown, 6 images]
[im 18/53  bone]
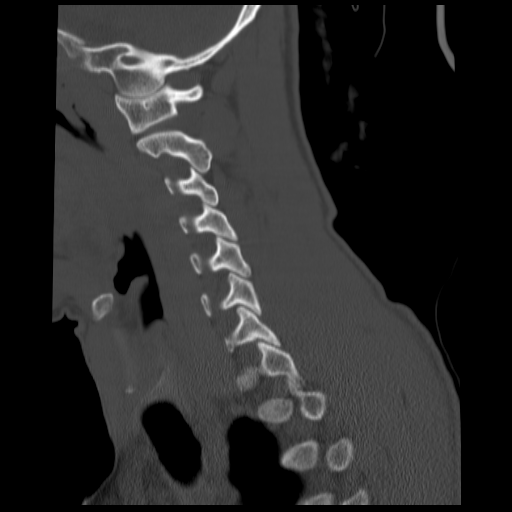
[im 22/53  bone]
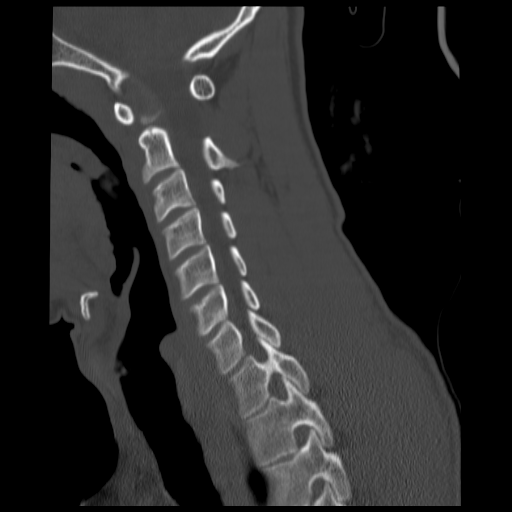
[im 27/53  soft-tissue]
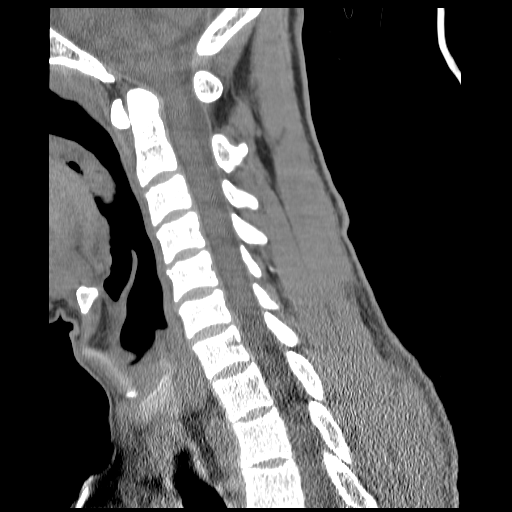
[im 27/53  bone]
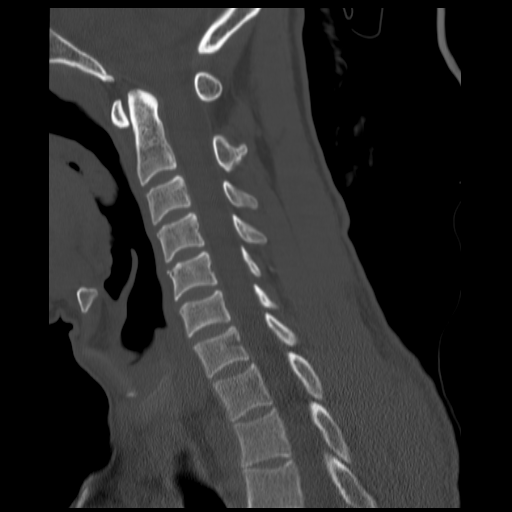
[im 31/53  bone]
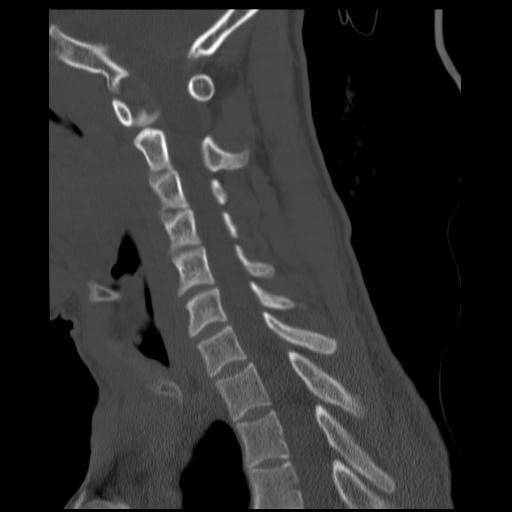
[im 35/53  bone]
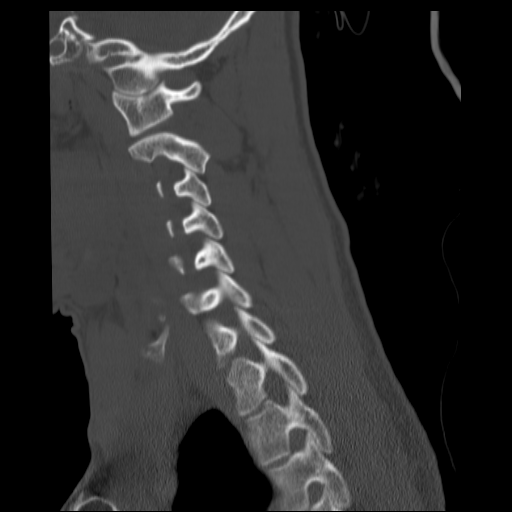

[Series 401: coronal · coronal · 0.41mm/px · 3 of 51 slices shown]
[im 11/51  bone]
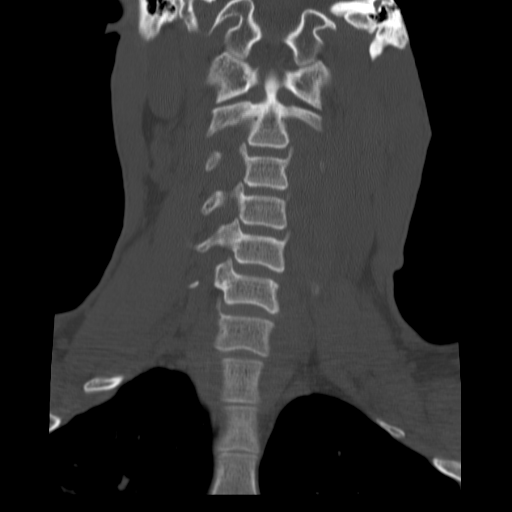
[im 21/51  bone]
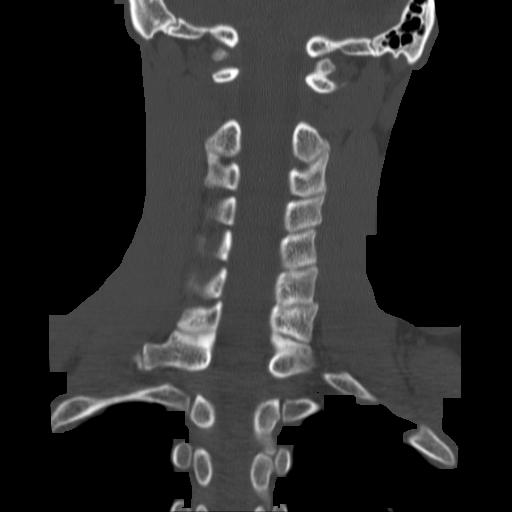
[im 31/51  bone]
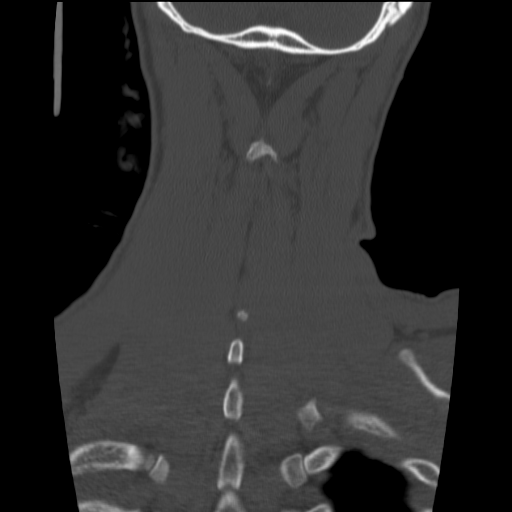

[Series 402: orthog · axial · 0.41mm/px · z∈[-196,-102]mm · 3 of 100 slices shown, 4 images]
[im 25/100  soft-tissue]
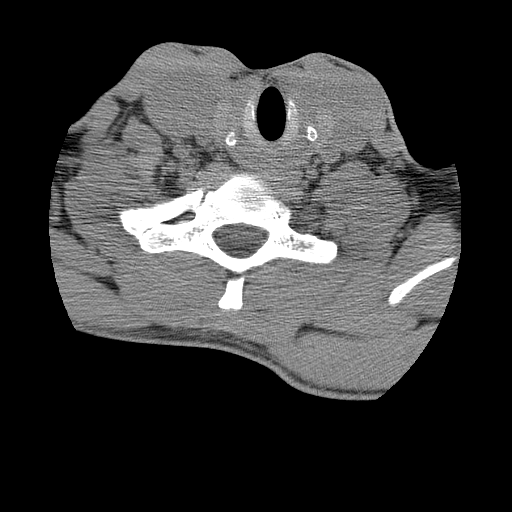
[im 25/100  bone]
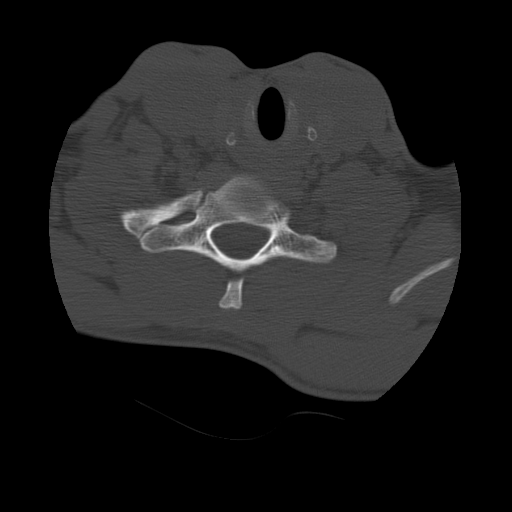
[im 50/100  bone]
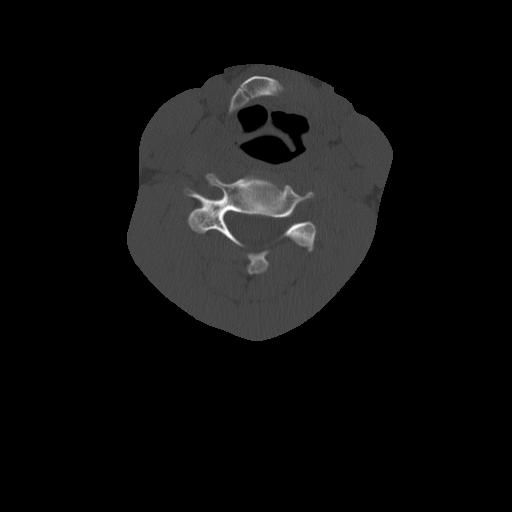
[im 75/100  bone]
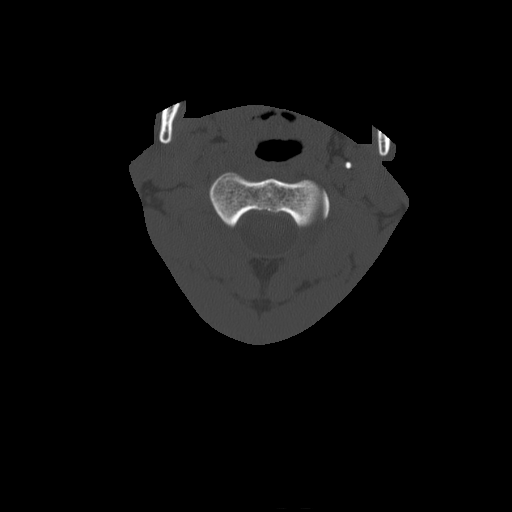

[16 of 33 positions shown; findings below may reference images not displayed]

FINDINGS: The cervical alignment is normal.  There is no evidence
of acute fracture or traumatic subluxation.  No acute soft tissue
findings are demonstrated.  There is no osseous foraminal stenosis.
A tiny calcified granuloma is noted at the right lung apex.
IMPRESSION: Negative cervical spine CT.

## 2014-07-02 ENCOUNTER — Emergency Department (HOSPITAL_COMMUNITY)
Admission: EM | Admit: 2014-07-02 | Discharge: 2014-07-02 | Disposition: A | Discharge status: Home / Self Care | Payer: Medicaid Other | Attending: Emergency Medicine | Admitting: Emergency Medicine

## 2014-07-02 ENCOUNTER — Encounter (HOSPITAL_COMMUNITY): Payer: Self-pay | Admitting: Emergency Medicine

## 2014-07-02 DIAGNOSIS — Y9389 Activity, other specified: Secondary | ICD-10-CM | POA: Diagnosis not present

## 2014-07-02 DIAGNOSIS — Y998 Other external cause status: Secondary | ICD-10-CM | POA: Insufficient documentation

## 2014-07-02 DIAGNOSIS — W260XXA Contact with knife, initial encounter: Secondary | ICD-10-CM | POA: Insufficient documentation

## 2014-07-02 DIAGNOSIS — S61211A Laceration without foreign body of left index finger without damage to nail, initial encounter: Secondary | ICD-10-CM | POA: Diagnosis not present

## 2014-07-02 DIAGNOSIS — Z720 Tobacco use: Secondary | ICD-10-CM | POA: Insufficient documentation

## 2014-07-02 DIAGNOSIS — Z8719 Personal history of other diseases of the digestive system: Secondary | ICD-10-CM | POA: Insufficient documentation

## 2014-07-02 DIAGNOSIS — Y92190 Kitchen in other specified residential institution as the place of occurrence of the external cause: Secondary | ICD-10-CM | POA: Insufficient documentation

## 2014-07-02 MED ORDER — BACITRACIN 500 UNIT/GM EX OINT
1.0000 "application " | TOPICAL_OINTMENT | Freq: Two times a day (BID) | CUTANEOUS | Status: DC
  Administered 2014-07-02: 1 via TOPICAL
  Filled 2014-07-02: qty 1.8
  Filled 2014-07-02 (×2): qty 0.9

## 2014-07-02 MED ORDER — IBUPROFEN 200 MG PO TABS
400.0000 mg | ORAL_TABLET | Freq: Once | ORAL | Status: AC
  Administered 2014-07-02: 400 mg via ORAL
  Filled 2014-07-02: qty 2

## 2014-07-02 NOTE — ED Provider Notes (Signed)
CSN: [id number]     Arrival date & time 07/02/14  0704 History   First MD Initiated Contact with Patient 07/02/14 [phone]     Chief Complaint  Patient presents with  . Extremity Laceration     (Consider location/radiation/quality/duration/timing/severity/associated sxs/prior Treatment) The history is provided by the patient.  pt c/o laceration to left index finger last night at approx 11 pm. Accidentally cut w  Knife when preparing food in kitchen at home. < 1 cm laceration to finger. Superficial. Bleeding stopped w pressure. No numbness/weakness, or loss of normal rom. No other injury. Last tetanus 4 yrs ago. Denies any other pain or injury.      Past Medical History  Diagnosis Date  . Alcoholic gastritis   . Alcohol abuse    Past Surgical History  Procedure Laterality Date  . Forearm surgery     Family History  Problem Relation Age of Onset  . Stroke     History  Substance Use Topics  . Smoking status: Current Every Day Smoker -- 0.50 packs/day for 15 years    Types: Cigarettes  . Smokeless tobacco: Never Used  . Alcohol Use: Yes     Comment: occasion    Review of Systems  Constitutional: Negative for fever.  Skin: Positive for wound.  Neurological: Negative for numbness.      Allergies  Review of patient's allergies indicates no known allergies.  Home Medications   Prior to Admission medications   Medication Sig Start Date End Date Taking? Authorizing Provider  HYDROcodone-acetaminophen (NORCO/VICODIN) 5-325 MG per tablet Take 1 tablet by mouth every 6 (six) hours as needed for moderate pain. 04/16/13   Jamesetta Orleanshristopher W Lawyer, PA-C  ibuprofen (ADVIL,MOTRIN) 800 MG tablet Take 1 tablet (800 mg total) by mouth every 8 (eight) hours as needed. 04/16/13   Jamesetta Orleanshristopher W Lawyer, PA-C   BP 144/103 mmHg  Pulse 85  Temp(Src) 98.3 F (36.8 C) (Oral)  Resp 16  SpO2 100% Physical Exam  Constitutional: He is oriented to person, place, and time. He appears  well-developed and well-nourished. No distress.  HENT:  Head: Atraumatic.  Eyes: Conjunctivae are normal.  Neck: Neck supple. No tracheal deviation present.  Cardiovascular: Normal rate.   Pulmonary/Chest: Effort normal. No accessory muscle usage. No respiratory distress.  Abdominal: He exhibits no distension.  Musculoskeletal: Normal range of motion.  approx 6-7 mm, very superficial laceration to middle phalanx left index finger, just through superficial skin. No bone or tendon exposed. Wound/soft tissue does not gap open. Normal cap refill distally. Normal flex and ext tendon fxn. sens grossly intact.   Neurological: He is alert and oriented to person, place, and time.  Steady gait. Left index finger w nomal motor/sens fxn.   Skin: Skin is warm and dry. He is not diaphoretic.  Psychiatric: He has a normal mood and affect.  Nursing note and vitals reviewed.   ED Course  Procedures (including critical care time) Labs Review    MDM   Wound does not appear amenable to sutures, is just through very superficial skin, and is 9+ hrs old.   Wound cleaned, bacitracin and sterile dressing. Tetanus utd, approx 4 yrs ago.   Motrin po.  Reviewed nursing notes and prior charts for additional history.     Suzi RootsKevin E Thomes Burak, MD 07/02/14 [id number]

## 2014-07-02 NOTE — ED Notes (Signed)
Pt states he accidentally cut his left index finger with a kitchen knife last night, shallow 1 cm laceration present to left index finger. Pt states he has had a tetanus shot in the past 5 years.

## 2014-07-02 NOTE — Discharge Instructions (Signed)
It was our pleasure to provide your ER care today - we hope that you feel better.  Keep area very clean. Wash with warm soap and water 1-2x/day.  Take tylenol/advil as need.  Your blood pressure is high today - follow up with primary care doctor for recheck of blood pressure in the next week.   Return to ER if worse, new symptoms, spreading redness, pus, infection, other concern.    Laceration Care, Adult A laceration is a cut or lesion that goes through all layers of the skin and into the tissue just beneath the skin. TREATMENT  Some lacerations may not require closure. Some lacerations may not be able to be closed due to an increased risk of infection. It is important to see your caregiver as soon as possible after an injury to minimize the risk of infection and maximize the opportunity for successful closure. If closure is appropriate, pain medicines may be given, if needed. The wound will be cleaned to help prevent infection. Your caregiver will use stitches (sutures), staples, wound glue (adhesive), or skin adhesive strips to repair the laceration. These tools bring the skin edges together to allow for faster healing and a better cosmetic outcome. However, all wounds will heal with a scar. Once the wound has healed, scarring can be minimized by covering the wound with sunscreen during the day for 1 full year. HOME CARE INSTRUCTIONS  For sutures or staples:  Keep the wound clean and dry.  If you were given a bandage (dressing), you should change it at least once a day. Also, change the dressing if it becomes wet or dirty, or as directed by your caregiver.  Wash the wound with soap and water 2 times a day. Rinse the wound off with water to remove all soap. Pat the wound dry with a clean towel.  After cleaning, apply a thin layer of the antibiotic ointment as recommended by your caregiver. This will help prevent infection and keep the dressing from sticking.  You may shower as usual  after the first 24 hours. Do not soak the wound in water until the sutures are removed.  Only take over-the-counter or prescription medicines for pain, discomfort, or fever as directed by your caregiver.  Get your sutures or staples removed as directed by your caregiver. For skin adhesive strips:  Keep the wound clean and dry.  Do not get the skin adhesive strips wet. You may bathe carefully, using caution to keep the wound dry.  If the wound gets wet, pat it dry with a clean towel.  Skin adhesive strips will fall off on their own. You may trim the strips as the wound heals. Do not remove skin adhesive strips that are still stuck to the wound. They will fall off in time. For wound adhesive:  You may briefly wet your wound in the shower or bath. Do not soak or scrub the wound. Do not swim. Avoid periods of heavy perspiration until the skin adhesive has fallen off on its own. After showering or bathing, gently pat the wound dry with a clean towel.  Do not apply liquid medicine, cream medicine, or ointment medicine to your wound while the skin adhesive is in place. This may loosen the film before your wound is healed.  If a dressing is placed over the wound, be careful not to apply tape directly over the skin adhesive. This may cause the adhesive to be pulled off before the wound is healed.  Avoid prolonged exposure to  sunlight or tanning lamps while the skin adhesive is in place. Exposure to ultraviolet light in the first year will darken the scar.  The skin adhesive will usually remain in place for 5 to 10 days, then naturally fall off the skin. Do not pick at the adhesive film. You may need a tetanus shot if:  You cannot remember when you had your last tetanus shot.  You have never had a tetanus shot. If you get a tetanus shot, your arm may swell, get red, and feel warm to the touch. This is common and not a problem. If you need a tetanus shot and you choose not to have one, there is a  rare chance of getting tetanus. Sickness from tetanus can be serious. SEEK MEDICAL CARE IF:   You have redness, swelling, or increasing pain in the wound.  You see a red line that goes away from the wound.  You have yellowish-white fluid (pus) coming from the wound.  You have a fever.  You notice a bad smell coming from the wound or dressing.  Your wound breaks open before or after sutures have been removed.  You notice something coming out of the wound such as wood or glass.  Your wound is on your hand or foot and you cannot move a finger or toe. SEEK IMMEDIATE MEDICAL CARE IF:   Your pain is not controlled with prescribed medicine.  You have severe swelling around the wound causing pain and numbness or a change in color in your arm, hand, leg, or foot.  Your wound splits open and starts bleeding.  You have worsening numbness, weakness, or loss of function of any joint around or beyond the wound.  You develop painful lumps near the wound or on the skin anywhere on your body. MAKE SURE YOU:   Understand these instructions.  Will watch your condition.  Will get help right away if you are not doing well or get worse. Document Released: 05/24/2005 Document Revised: 08/16/2011 Document Reviewed: 11/17/2010 [facility] Patient Information 2015 [place], [place]. This information is not intended to replace advice given to you by your health care provider. Make sure you discuss any questions you have with your health care provider.    Hypertension Hypertension, commonly called high blood pressure, is when the force of blood pumping through your arteries is too strong. Your arteries are the blood vessels that carry blood from your heart throughout your body. A blood pressure reading consists of a higher number over a lower number, such as 110/72. The higher number (systolic) is the pressure inside your arteries when your heart pumps. The lower number (diastolic) is the pressure inside  your arteries when your heart relaxes. Ideally you want your blood pressure below 120/80. Hypertension forces your heart to work harder to pump blood. Your arteries may become narrow or stiff. Having hypertension puts you at risk for heart disease, stroke, and other problems.  RISK FACTORS Some risk factors for high blood pressure are controllable. Others are not.  Risk factors you cannot control include:   Race. You may be at higher risk if you are African American.  Age. Risk increases with age.  Gender. Men are at higher risk than women before age 32 years. After age 32, women are at higher risk than men. Risk factors you can control include:  Not getting enough exercise or physical activity.  Being overweight.  Getting too much fat, sugar, calories, or salt in your diet.  Drinking too much alcohol.  SIGNS AND SYMPTOMS Hypertension does not usually cause signs or symptoms. Extremely high blood pressure (hypertensive crisis) may cause headache, anxiety, shortness of breath, and nosebleed. DIAGNOSIS  To check if you have hypertension, your health care provider will measure your blood pressure while you are seated, with your arm held at the level of your heart. It should be measured at least twice using the same arm. Certain conditions can cause a difference in blood pressure between your right and left arms. A blood pressure reading that is higher than normal on one occasion does not mean that you need treatment. If one blood pressure reading is high, ask your health care provider about having it checked again. TREATMENT  Treating high blood pressure includes making lifestyle changes and possibly taking medicine. Living a healthy lifestyle can help lower high blood pressure. You may need to change some of your habits. Lifestyle changes may include:  Following the DASH diet. This diet is high in fruits, vegetables, and whole grains. It is low in salt, red meat, and added sugars.  Getting  at least 2 hours of brisk physical activity every week.  Losing weight if necessary.  Not smoking.  Limiting alcoholic beverages.  Learning ways to reduce stress. If lifestyle changes are not enough to get your blood pressure under control, your health care provider may prescribe medicine. You may need to take more than one. Work closely with your health care provider to understand the risks and benefits. HOME CARE INSTRUCTIONS  Have your blood pressure rechecked as directed by your health care provider.   Take medicines only as directed by your health care provider. Follow the directions carefully. Blood pressure medicines must be taken as prescribed. The medicine does not work as well when you skip doses. Skipping doses also puts you at risk for problems.   Do not smoke.   Monitor your blood pressure at home as directed by your health care provider. SEEK MEDICAL CARE IF:   You think you are having a reaction to medicines taken.  You have recurrent headaches or feel dizzy.  You have swelling in your ankles.  You have trouble with your vision. SEEK IMMEDIATE MEDICAL CARE IF:  You develop a severe headache or confusion.  You have unusual weakness, numbness, or feel faint.  You have severe chest or abdominal pain.  You vomit repeatedly.  You have trouble breathing. MAKE SURE YOU:   Understand these instructions.  Will watch your condition.  Will get help right away if you are not doing well or get worse. Document Released: 05/24/2005 Document Revised: 10/08/2013 Document Reviewed: 03/16/2013 [facility] Patient Information 2015 [place], [place]. This information is not intended to replace advice given to you by your health care provider. Make sure you discuss any questions you have with your health care provider.

## 2015-01-27 ENCOUNTER — Emergency Department (HOSPITAL_COMMUNITY)
Admission: EM | Admit: 2015-01-27 | Discharge: 2015-01-27 | Disposition: A | Discharge status: Home / Self Care | Payer: Medicaid Other | Attending: Emergency Medicine | Admitting: Emergency Medicine

## 2015-01-27 ENCOUNTER — Encounter (HOSPITAL_COMMUNITY): Payer: Self-pay | Admitting: *Deleted

## 2015-01-27 DIAGNOSIS — K2901 Acute gastritis with bleeding: Secondary | ICD-10-CM

## 2015-01-27 DIAGNOSIS — R1013 Epigastric pain: Secondary | ICD-10-CM | POA: Diagnosis present

## 2015-01-27 DIAGNOSIS — K922 Gastrointestinal hemorrhage, unspecified: Secondary | ICD-10-CM | POA: Diagnosis not present

## 2015-01-27 DIAGNOSIS — Z720 Tobacco use: Secondary | ICD-10-CM | POA: Diagnosis not present

## 2015-01-27 LAB — COMPREHENSIVE METABOLIC PANEL
ALT: 15 U/L — ABNORMAL LOW (ref 17–63)
AST: 25 U/L (ref 15–41)
Albumin: 4 g/dL (ref 3.5–5.0)
Alkaline Phosphatase: 66 U/L (ref 38–126)
Anion gap: 9 (ref 5–15)
BUN: 9 mg/dL (ref 6–20)
CHLORIDE: 101 mmol/L (ref 101–111)
CO2: 22 mmol/L (ref 22–32)
Calcium: 8.9 mg/dL (ref 8.9–10.3)
Creatinine, Ser: 0.77 mg/dL (ref 0.61–1.24)
GFR calc Af Amer: >60 mL/min (ref >60)
GFR calc non Af Amer: >60 mL/min (ref >60)
Glucose, Bld: 108 mg/dL — ABNORMAL HIGH (ref 65–99)
POTASSIUM: 3.4 mmol/L — AB (ref 3.5–5.1)
Sodium: 132 mmol/L — ABNORMAL LOW (ref 135–145)
Total Bilirubin: 0.8 mg/dL (ref 0.3–1.2)
Total Protein: 7.7 g/dL (ref 6.5–8.1)

## 2015-01-27 LAB — I-STAT CHEM 8, ED
BUN: 7 mg/dL (ref 6–20)
Calcium, Ion: 1.12 mmol/L (ref 1.12–1.23)
Chloride: 98 mmol/L — ABNORMAL LOW (ref 101–111)
Creatinine, Ser: 0.8 mg/dL (ref 0.61–1.24)
Glucose, Bld: 95 mg/dL (ref 65–99)
HEMATOCRIT: 41 % (ref 39.0–52.0)
HEMOGLOBIN: 13.9 g/dL (ref 13.0–17.0)
Potassium: 3.5 mmol/L (ref 3.5–5.1)
SODIUM: 134 mmol/L — AB (ref 135–145)
TCO2: 25 mmol/L (ref 0–100)

## 2015-01-27 LAB — URINALYSIS, ROUTINE W REFLEX MICROSCOPIC
Glucose, UA: NEGATIVE mg/dL
Hgb urine dipstick: NEGATIVE
Ketones, ur: NEGATIVE mg/dL
LEUKOCYTES UA: NEGATIVE
NITRITE: NEGATIVE
Protein, ur: 30 mg/dL — AB
SPECIFIC GRAVITY, URINE: 1.031 — AB (ref 1.005–1.030)
Urobilinogen, UA: 0.2 mg/dL (ref 0.0–1.0)
pH: 6 (ref 5.0–8.0)

## 2015-01-27 LAB — URINE MICROSCOPIC-ADD ON

## 2015-01-27 LAB — CBC
HEMATOCRIT: 38.5 % — AB (ref 39.0–52.0)
Hemoglobin: 12.6 g/dL — ABNORMAL LOW (ref 13.0–17.0)
MCH: 26.7 pg (ref 26.0–34.0)
MCHC: 32.7 g/dL (ref 30.0–36.0)
MCV: 81.6 fL (ref 78.0–100.0)
Platelets: 269 10*3/uL (ref 150–400)
RBC: 4.72 MIL/uL (ref 4.22–5.81)
RDW: 17.5 % — ABNORMAL HIGH (ref 11.5–15.5)
WBC: 7.2 10*3/uL (ref 4.0–10.5)

## 2015-01-27 LAB — POC OCCULT BLOOD, ED: FECAL OCCULT BLD: POSITIVE — AB

## 2015-01-27 LAB — LIPASE, BLOOD: LIPASE: 21 U/L — AB (ref 22–51)

## 2015-01-27 MED ORDER — PANTOPRAZOLE SODIUM 40 MG PO TBEC: 40.0000 mg | DELAYED_RELEASE_TABLET | Freq: Two times a day (BID) | ORAL | Status: DC

## 2015-01-27 MED ORDER — LIDOCAINE VISCOUS 2 % MT SOLN
15.0000 mL | Freq: Once | OROMUCOSAL | Status: AC
  Administered 2015-01-27: 15 mL via OROMUCOSAL
  Filled 2015-01-27: qty 15

## 2015-01-27 MED ORDER — ALUM & MAG HYDROXIDE-SIMETH 200-200-20 MG/5ML PO SUSP
15.0000 mL | Freq: Once | ORAL | Status: AC
  Administered 2015-01-27: 15 mL via ORAL
  Filled 2015-01-27: qty 30

## 2015-01-27 NOTE — ED Provider Notes (Signed)
CSN: [id number]     Arrival date & time 01/27/15  1123 History   First MD Initiated Contact with Patient 01/27/15 1342     Chief Complaint  Patient presents with  . Abdominal Pain  . Emesis     (Consider location/radiation/quality/duration/timing/severity/associated sxs/prior Treatment) Patient is a 34 y.o. male presenting with abdominal pain and vomiting. The history is provided by the patient.  Abdominal Pain Pain location:  Epigastric Pain quality: bloating and burning   Pain radiates to:  Does not radiate Pain severity:  Moderate Onset quality:  Gradual Duration:  2 days Timing:  Constant Progression:  Worsening Chronicity:  New Context: alcohol use   Relieved by:  Nothing Worsened by:  Nothing tried Ineffective treatments:  None tried Associated symptoms: diarrhea, nausea and vomiting   Associated symptoms: no chest pain, no chills, no fever and no shortness of breath   Emesis Associated symptoms: abdominal pain and diarrhea   Associated symptoms: no arthralgias, no chills, no headaches and no myalgias     Past Medical History  Diagnosis Date  . Alcoholic gastritis   . Alcohol abuse    Past Surgical History  Procedure Laterality Date  . Forearm surgery     Family History  Problem Relation Age of Onset  . Stroke     Social History  Substance Use Topics  . Smoking status: Current Every Day Smoker -- 0.50 packs/day for 15 years    Types: Cigarettes  . Smokeless tobacco: Never Used  . Alcohol Use: Yes     Comment: occasion    Review of Systems  Constitutional: Negative for fever and chills.  HENT: Negative for congestion and facial swelling.   Eyes: Negative for discharge and visual disturbance.  Respiratory: Negative for shortness of breath.   Cardiovascular: Negative for chest pain and palpitations.  Gastrointestinal: Positive for nausea, vomiting, abdominal pain, diarrhea and blood in stool (dark stools).  Musculoskeletal: Negative for myalgias and  arthralgias.  Skin: Negative for color change and rash.  Neurological: Negative for tremors, syncope and headaches.  Psychiatric/Behavioral: Negative for confusion and dysphoric mood.      Allergies  Review of patient's allergies indicates no known allergies.  Home Medications   Prior to Admission medications   Medication Sig Start Date End Date Taking? Authorizing Provider  Aspirin-Acetaminophen-Caffeine (GOODY HEADACHE PO) Take 1 each by mouth every 8 (eight) hours as needed (pain).   Yes Historical Provider, MD  pantoprazole (PROTONIX) 40 MG tablet Take 1 tablet (40 mg total) by mouth 2 (two) times daily before a meal. 01/27/15   Melene Plan, DO   BP 128/78 mmHg  Pulse 71  Temp(Src) 99.1 F (37.3 C) (Oral)  Resp 18  Ht 5\' 10"  (1.778 m)  Wt 160 lb (72.576 kg)  BMI 22.96 kg/m2  SpO2 100% Physical Exam  Constitutional: He is oriented to person, place, and time. He appears well-developed and well-nourished.  HENT:  Head: Normocephalic and atraumatic.  Eyes: EOM are normal. Pupils are equal, round, and reactive to light.  Neck: Normal range of motion. Neck supple. No JVD present.  Cardiovascular: Normal rate and regular rhythm.  Exam reveals no gallop and no friction rub.   No murmur heard. Pulmonary/Chest: No respiratory distress. He has no wheezes.  Abdominal: He exhibits no distension. There is tenderness (mild epigastric). There is no rebound and no guarding.  Genitourinary: Guaiac positive stool.  No stool in the vault, no gross blood  Musculoskeletal: Normal range of motion.  Neurological: He  is alert and oriented to person, place, and time.  Skin: No rash noted. No pallor.  Psychiatric: He has a normal mood and affect. His behavior is normal.    ED Course  Procedures (including critical care time) Labs Review Labs Reviewed  LIPASE, BLOOD - Abnormal; Notable for the following:    Lipase 21 (*)    All other components within normal limits  COMPREHENSIVE METABOLIC  PANEL - Abnormal; Notable for the following:    Sodium 132 (*)    Potassium 3.4 (*)    Glucose, Bld 108 (*)    ALT 15 (*)    All other components within normal limits  CBC - Abnormal; Notable for the following:    Hemoglobin 12.6 (*)    HCT 38.5 (*)    RDW 17.5 (*)    All other components within normal limits  URINALYSIS, ROUTINE W REFLEX MICROSCOPIC (NOT AT [facility]) - Abnormal; Notable for the following:    Color, Urine AMBER (*)    Specific Gravity, Urine 1.031 (*)    Bilirubin Urine SMALL (*)    Protein, ur 30 (*)    All other components within normal limits  POC OCCULT BLOOD, ED - Abnormal; Notable for the following:    Fecal Occult Bld POSITIVE (*)    All other components within normal limits  I-STAT CHEM 8, ED - Abnormal; Notable for the following:    Sodium 134 (*)    Chloride 98 (*)    All other components within normal limits  URINE MICROSCOPIC-ADD ON    Imaging Review No results found. I have personally reviewed and evaluated these images and lab results as part of my medical decision-making.   EKG Interpretation None      MDM   Final diagnoses:  Gastrointestinal hemorrhage associated with acute gastritis    Patient is a 34 y.o. male who presents with epigastric abdominal pain.  This started a couple days ago.  Patient with recent difficulty with alcoholism, has just stopped drinking a few days ago.  Dark stools, multiple with some emesis.  Denies fevers, chills.  Denies presyncopal feeling.  Multiple BM's in last two days.  Mild epigastric pain on exam, Rectal exam with no stool, guac +.  Given GI cocktail with some improvement of pain. Patient initial hgb with mild anemia compared to result from 2 years ago.  Repeat after 2 hours of observation stable, no further events in the ED.  No noted hypotension, not tachycardic.  Feel patient stable or discharge, started on protonix.      I have discussed the diagnosis/risks/treatment options with the patient and family  and believe the pt to be eligible for discharge home to follow-up with PCP. We also discussed returning to the ED immediately if new or worsening sx occur. We discussed the sx which are most concerning (e.g., syncope, sudden worsening stool output.) that necessitate immediate return. Medications administered to the patient during their visit and any new prescriptions provided to the patient are listed below.  Medications given during this visit Medications  alum & mag hydroxide-simeth (MAALOX/MYLANTA) 200-200-20 MG/5ML suspension 15 mL (15 mLs Oral Given 01/27/15 1406)  lidocaine (XYLOCAINE) 2 % viscous mouth solution 15 mL (15 mLs Mouth/Throat Given 01/27/15 1405)    Discharge Medication List as of 01/27/2015  2:45 PM    START taking these medications   Details  pantoprazole (PROTONIX) 40 MG tablet Take 1 tablet (40 mg total) by mouth 2 (two) times daily before a meal.,  Starting 01/27/2015, Until Discontinued, Print         The patient appears reasonably screen and/or stabilized for discharge and I doubt any other medical condition or other [facility] requiring further screening, evaluation, or treatment in the ED at this time prior to discharge.       Melene Plan, DO 01/27/15 1806

## 2015-01-27 NOTE — ED Notes (Signed)
Pt reports abd pain since yesterday, vomited 2x today. Reports less than shot glass about of blood in stool. Pain 10/10. Hx of alcohol abuse, last ETOH on Saturday.

## 2015-01-27 NOTE — Discharge Instructions (Signed)
Gastrointestinal Bleeding °Gastrointestinal bleeding is bleeding somewhere along the path that food travels through the body (digestive tract). This path is anywhere between the mouth and the opening of the butt (anus). You may have blood in your throw up (vomit) or in your poop (stools). If there is a lot of bleeding, you may need to stay in the hospital. °HOME CARE °· Only take medicine as told by your doctor. °· Eat foods with fiber such as whole grains, fruits, and vegetables. You can also try eating 1 to 3 prunes a day. °· Drink enough fluids to keep your pee (urine) clear or pale yellow. °GET HELP RIGHT AWAY IF:  °· Your bleeding gets worse. °· You feel dizzy, weak, or you pass out (faint). °· You have bad cramps in your back or belly (abdomen). °· You have large blood clumps (clots) in your poop. °· Your problems are getting worse. °MAKE SURE YOU:  °· Understand these instructions. °· Will watch your condition. °· Will get help right away if you are not doing well or get worse. °Document Released: 03/02/2008 Document Revised: 05/10/2012 Document Reviewed: 05/03/2011 °Exit[employer]® Patient Information ©2015 [employer]. This information is not intended to replace advice given to you by your health care provider. Make sure you discuss any questions you have with your health care provider. ° ° °

## 2015-01-27 NOTE — ED Notes (Signed)
MD at bedside. 

## 2015-09-28 ENCOUNTER — Encounter (HOSPITAL_COMMUNITY): Payer: Self-pay | Admitting: *Deleted

## 2015-09-28 ENCOUNTER — Emergency Department (HOSPITAL_COMMUNITY): Payer: Medicaid Other

## 2015-09-28 ENCOUNTER — Emergency Department (HOSPITAL_COMMUNITY)
Admission: EM | Admit: 2015-09-28 | Discharge: 2015-09-28 | Disposition: A | Discharge status: Home / Self Care | Payer: Medicaid Other | Attending: Emergency Medicine | Admitting: Emergency Medicine

## 2015-09-28 DIAGNOSIS — S6991XA Unspecified injury of right wrist, hand and finger(s), initial encounter: Secondary | ICD-10-CM | POA: Diagnosis present

## 2015-09-28 DIAGNOSIS — Y9389 Activity, other specified: Secondary | ICD-10-CM | POA: Diagnosis not present

## 2015-09-28 DIAGNOSIS — Y998 Other external cause status: Secondary | ICD-10-CM | POA: Insufficient documentation

## 2015-09-28 DIAGNOSIS — W231XXA Caught, crushed, jammed, or pinched between stationary objects, initial encounter: Secondary | ICD-10-CM | POA: Diagnosis not present

## 2015-09-28 DIAGNOSIS — F17210 Nicotine dependence, cigarettes, uncomplicated: Secondary | ICD-10-CM | POA: Insufficient documentation

## 2015-09-28 DIAGNOSIS — S60111A Contusion of right thumb with damage to nail, initial encounter: Secondary | ICD-10-CM | POA: Diagnosis not present

## 2015-09-28 DIAGNOSIS — Z8719 Personal history of other diseases of the digestive system: Secondary | ICD-10-CM | POA: Insufficient documentation

## 2015-09-28 DIAGNOSIS — Y9289 Other specified places as the place of occurrence of the external cause: Secondary | ICD-10-CM | POA: Insufficient documentation

## 2015-09-28 MED ORDER — IBUPROFEN 800 MG PO TABS
800.0000 mg | ORAL_TABLET | Freq: Once | ORAL | Status: AC
  Administered 2015-09-28: 800 mg via ORAL
  Filled 2015-09-28: qty 1

## 2015-09-28 NOTE — Discharge Instructions (Signed)
Your x-ray does not show broken bone. You've been given referral to hand surgeon above if you are still having issues bending the top of your thumb after the swelling goes down. Continue to take Tylenol and Motrin as needed for pain control  Nail Bed Injury The nail bed is the soft tissue under a fingernail or toenail that is the origin for new nail growth. Various types of injuries can occur at the nail bed. These injuries may involve bruising or bleeding under the nail, cuts (lacerations) in the nail or nail bed, or loss of a part of the nail or the whole nail (avulsion). In some cases, a nail bed injury accompanies another injury, such as a break (fracture) of the bone at the tip of the finger or toe. Nail bed injuries are common in people who have jobs that require performing manual tasks with their hands, such as carpenters and landscapers.  The nail bed includes the growth center of the nail. If this growth center is damaged, the injured nail may not grow back normally if at all. The regrown nail might have an abnormal shape or appearance. It can take several months for a damaged or torn-off nail to regrow. Depending on the nature and extent of the nail bed injury, there may be a permanent disruption of normal nail growth. CAUSES  Damage to the nail bed area is usually caused by crushing, pinching, cutting, or tearing injuries of the fingertip or toe. For example, these injuries may occur when a fingertip gets caught in a door, hit by a hammer, or damaged in accidents involving electrical tools or power machinery.  SYMPTOMS  Symptoms vary depending on the nature of the injury. Symptoms may include:  Pain in the injured area.  Bleeding.  Swelling.  Discoloration.  Collection of blood under the nail (hematoma).  Deformed or split nail.  Loose nail (not stuck to the nail bed).  Loss of all or part of the nail. DIAGNOSIS  Your caregiver will take a medical history and examine the injured  area. You will be asked to describe how the injury occurred. X-rays may be done to see if you have a fracture. Your caregiver might also check for conditions that may affect healing, such as diabetes, nerve problems, or poor circulation.  TREATMENT  Treatment depends on the type of injury.  The injury may not require any special treatment other than keeping the area clean and free of infection.   Your caregiver may drain the collection of blood from under the nail. This can be done by making a small hole in the nail.   Your caregiver may remove all or part of your nail. This might be necessary to stitch (suture) any laceration in the nail bed. Before doing this, the caregiver will likely give you medication to numb the nail area (local anesthetic). In some cases, the caregiver may choose to numb the entire finger or toe (digital nerve block). Depending on the location and size of the nail bed injury, an avulsed nail is sometimes stitched back in place to provide temporary protection to the nail bed until the new nail grows in.  Your caregiver may apply bandages (dressings) or splints to the area.  You might be prescribed antibiotic medication to help prevent infection.  For certain injuries, your caregiver may direct you to see a hand or foot specialist.  You may need a tetanus shot if:  You cannot remember when you had your last tetanus shot.  You have never had a tetanus shot.  The injury broke your skin. If you get a tetanus shot, your arm may swell, get red, and feel warm to the touch. This is common and not a problem. If you need a tetanus shot and you choose not to have one, there is a rare chance of getting tetanus. Sickness from tetanus can be serious. HOME CARE INSTRUCTIONS   Keep your hand or foot raised (elevated) to relieve pain and swelling.   For an injured toenail, lie in bed or on a couch with your leg on pillows. You can also sit in a recliner with your leg up. Avoid  walking or letting your leg dangle. When you walk, wear an open-toe shoe.  For an injured fingernail, keep your hand above the level of your heart. Use pillows on a table or on the arm of your chair while sitting. Use them on your bed while sleeping.   Keep your injury protected with dressings or splints as directed by your caregiver.   Keep any dressings clean and dry. Change or remove your dressings as directed by your caregiver.   Only take over-the-counter or prescription medications as directed by your caregiver. If you were prescribed antibiotics, take them as directed. Finish them even if you start to feel better.   Follow up with your caregiver as directed.  SEEK MEDICAL CARE IF:   You have pain that is not controlled with medication.   You have any problems caring for your injury.  SEEK IMMEDIATE MEDICAL CARE IF:   You have increased pain, drainage, or bleeding in the injured area.   You have redness, soreness, and swelling (inflammation) in the injured area.  You have a fever or persistent symptoms for more than 2-3 days.  You have a fever and your symptoms suddenly get worse.  You have swelling that spreads from your finger into your hand or from your toe into your foot.  MAKE SURE YOU:  Understand these instructions.  Will watch your condition.  Will get help right away if you are not doing well or get worse.   This information is not intended to replace advice given to you by your health care provider. Make sure you discuss any questions you have with your health care provider.   Document Released: 07/01/2004 Document Revised: 09/18/2012 Document Reviewed: 06/15/2012 Elsevier Interactive Patient Education [employer]2016 Elsevier Inc.

## 2015-09-28 NOTE — ED Notes (Signed)
Pt c/o R thumb injury r/t shutting it in a car door last night.  Pain score 10/10.  Pt has not taken anything for pain.  Blood noted under nail.

## 2015-09-28 NOTE — ED Provider Notes (Signed)
CSN: [id number]     Arrival date & time 09/28/15  0747 History   First MD Initiated Contact with Patient 09/28/15 0759     Chief Complaint  Patient presents with  . Thumb Injury      (Consider location/radiation/quality/duration/timing/severity/associated sxs/prior Treatment) HPI 35 year old right hand dominant male who presents with right thumb pain. Accidentally closed the car door on his right thumb yesterday. Woke up this morning with thumb pain, swelling, and blood under the finger nail. No other injuries. No numbness or weakness.   Past Medical History  Diagnosis Date  . Alcoholic gastritis   . Alcohol abuse    Past Surgical History  Procedure Laterality Date  . Forearm surgery     Family History  Problem Relation Age of Onset  . Stroke     Social History  Substance Use Topics  . Smoking status: Current Every Day Smoker -- 0.50 packs/day for 15 years    Types: Cigarettes  . Smokeless tobacco: Never Used  . Alcohol Use: Yes     Comment: occasion    Review of Systems  Constitutional: Negative for fever.  Skin: Negative for wound.  Allergic/Immunologic: Negative for immunocompromised state.  Neurological: Negative for weakness and numbness.  Hematological: Does not bruise/bleed easily.  All other systems reviewed and are negative.     Allergies  Review of patient's allergies indicates no known allergies.  Home Medications   Prior to Admission medications   Medication Sig Start Date End Date Taking? Authorizing Provider  pantoprazole (PROTONIX) 40 MG tablet Take 1 tablet (40 mg total) by mouth 2 (two) times daily before a meal. Patient not taking: Reported on 09/28/2015 01/27/15   Melene Plan, DO   BP 132/98 mmHg  Pulse 85  Temp(Src) 98.2 F (36.8 C) (Oral)  Resp 16  Ht  (1.753 m)  Wt 155 lb (70.308 kg)  BMI 22.88 kg/m2  SpO2 100% Physical Exam  Constitutional: He is oriented to person, place, and time. He appears well-developed and well-nourished.   HENT:  Head: Normocephalic and atraumatic.  Eyes: Right eye exhibits no discharge. Left eye exhibits no discharge.  Neck: Neck supple.  Cardiovascular: Intact distal pulses.   Pulmonary/Chest: Effort normal.  Abdominal: Soft.  Musculoskeletal:  Mild swelling distal to PIP of right thumb, with subungual hematoma. Limited flexion due to pain.  Neurological: He is alert and oriented to person, place, and time.  In tact innervation of medial, ulnar, and radial nerves. In tact sensation over distal pad of the right thumb.  Skin: Skin is warm and dry.  Psychiatric: He has a normal mood and affect.    ED Course  .Nail Removal Date/Time: 09/28/2015 9:16 AM Performed by: Crista Curb DUO Authorized by: Crista Curb DUO Consent: Verbal consent obtained. Risks and benefits: risks, benefits and alternatives were discussed Consent given by: patient Patient identity confirmed: verbally with patient Time out: Immediately prior to procedure a "time out" was called to verify the correct patient, procedure, equipment, support staff and site/side marked as required. Location: right hand Location details: right thumb Patient sedated: no Preparation: skin prepped with alcohol Nail removal amount: Nail trephinated for subungual hematoma. Dressing: gauze roll and splint Patient tolerance: Patient tolerated the procedure well with no immediate complications   (including critical care time) Labs Review Labs Reviewed - No data to display  Imaging Review Dg Finger Thumb Right  09/28/2015  CLINICAL DATA:  Slammed thumb in car door last night. Thumb pain and swelling.  Initial encounter. EXAM: RIGHT THUMB 2+V COMPARISON:  None. FINDINGS: There is no evidence of fracture or dislocation. There is no evidence of arthropathy or other focal bone abnormality. Soft tissues are unremarkable IMPRESSION: Negative. Electronically Signed   By: Myles RosenthalJohn  Stahl M.D.   On: 09/28/2015 08:57   I have personally reviewed and  evaluated these images and lab results as part of my medical decision-making.   EKG Interpretation None      MDM   Final diagnoses:  Thumb injury, right, initial encounter  Subungual hematoma of right thumb, initial encounter    X-ray without fracture. Hand is neurovascularly tact. There is evidence of about 50% subungual hematoma of the right thumb that is trephinated. Does have significant Limited flexion involving the PIP of the right thumb, but there is some soft tissue swelling and question possible tendon injury but may also be limited by the soft tissue swelling. I did place him in a splint holding him in extension, and will have him follow-up with hand surgery as needed for reexamination.    Lavera Guiseana Duo Lamere Lightner, MD 09/28/15 [phone]

## 2015-09-28 NOTE — ED Notes (Signed)
MD at bedside. 

## 2016-01-18 ENCOUNTER — Emergency Department (HOSPITAL_COMMUNITY)
Admission: EM | Admit: 2016-01-18 | Discharge: 2016-01-18 | Disposition: A | Discharge status: Home / Self Care | Payer: Medicaid Other | Attending: Emergency Medicine | Admitting: Emergency Medicine

## 2016-01-18 ENCOUNTER — Encounter (HOSPITAL_COMMUNITY): Payer: Self-pay | Admitting: Emergency Medicine

## 2016-01-18 DIAGNOSIS — L03116 Cellulitis of left lower limb: Secondary | ICD-10-CM

## 2016-01-18 DIAGNOSIS — R590 Localized enlarged lymph nodes: Secondary | ICD-10-CM

## 2016-01-18 DIAGNOSIS — L539 Erythematous condition, unspecified: Secondary | ICD-10-CM | POA: Diagnosis present

## 2016-01-18 DIAGNOSIS — F17210 Nicotine dependence, cigarettes, uncomplicated: Secondary | ICD-10-CM | POA: Insufficient documentation

## 2016-01-18 DIAGNOSIS — F1290 Cannabis use, unspecified, uncomplicated: Secondary | ICD-10-CM | POA: Insufficient documentation

## 2016-01-18 MED ORDER — CEPHALEXIN 500 MG PO CAPS
500.0000 mg | ORAL_CAPSULE | Freq: Once | ORAL | Status: AC
  Administered 2016-01-18: 500 mg via ORAL
  Filled 2016-01-18: qty 1

## 2016-01-18 MED ORDER — IBUPROFEN 200 MG PO TABS
600.0000 mg | ORAL_TABLET | Freq: Once | ORAL | Status: AC
  Administered 2016-01-18: 600 mg via ORAL
  Filled 2016-01-18: qty 3

## 2016-01-18 MED ORDER — CEPHALEXIN 500 MG PO CAPS: 500.0000 mg | ORAL_CAPSULE | Freq: Four times a day (QID) | ORAL | 0 refills | Status: DC

## 2016-01-18 NOTE — ED Notes (Signed)
Pt ambulatory and independent at discharge.  Verbalized understanding of discharge instructions 

## 2016-01-18 NOTE — Discharge Instructions (Signed)
The swelling in your groin is likely a swollen lymphnode that has reacted to the infection on your leg. Take antibiotic as prescribed. Take ibuprofen as needed for pain. Follow up with [facility] if your symptoms do not improve. Return to the ED if you experience increased redness or swelling, increased pain, fevers or chills.

## 2016-01-18 NOTE — ED Triage Notes (Signed)
Patient states he has a bump on his groin, noticed it Friday, now it is swelling and is painful, warm to touch. Patient denies any manipulation of the site. Patient has not taken anything for pain at home.

## 2016-01-21 NOTE — ED Provider Notes (Signed)
[facility]-EMERGENCY DEPT Provider Note   CSN: [id number] Arrival date & time: 01/18/16  1515     History   Chief Complaint Chief Complaint  Patient presents with  . Abscess    HPI Wesley Stewart is a 35 y.o. male with no significant pmhx who presents to the ED today c/o a lump in his left groin. Pt states that 3 days ago he went fishing and got bit by something on his left lower leg. Pt states the area has become red and inflamed. The redness has begun to spread up his leg per pt. Pt subsequently noticed a lump on the left side of his groin which has not been bothering him until today when it became sore to the touch. Lump is not red or warm.  Pt denies and difficulty urinating, fever, chills, dysuria, testicular pain.   HPI  Past Medical History:  Diagnosis Date  . Alcohol abuse   . Alcoholic gastritis     Patient Active Problem List   Diagnosis Date Noted  . 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 Alcohol abuse 03/02/2012  . Weakness of wrist 02/29/2012    Past Surgical History:  Procedure Laterality Date  . FOREARM SURGERY         Home Medications    Prior to Admission medications   Medication Sig Start Date End Date Taking? Authorizing Provider  cephALEXin (KEFLEX) 500 MG capsule Take 1 capsule (500 mg total) by mouth 4 (four) times daily. 01/18/16   Mariusz Jubb Tripp Mithcell Schumpert, PA-C  pantoprazole (PROTONIX) 40 MG tablet Take 1 tablet (40 mg total) by mouth 2 (two) times daily before a meal. Patient not taking: Reported on 09/28/2015 01/27/15   Melene Planan Floyd, DO    Family History Family History  Problem Relation Age of Onset  . Stroke      Social History Social History  Substance Use Topics  . Smoking status: Current Every Day Smoker    Packs/day: 0.50    Years: 15.00    Types: Cigarettes  . Smokeless tobacco: Never Used  . Alcohol use Yes     Comment: occasion     Allergies   Review of patient's allergies indicates no known  allergies.   Review of Systems Review of Systems  All other systems reviewed and are negative.    Physical Exam Updated Vital Signs BP 142/92 (BP Location: Right Arm)   Pulse 77   Temp 99.3 F (37.4 C) (Oral)   Resp 16   Ht 5\' 10"  (1.778 m)   Wt 65.8 kg   SpO2 98%   BMI 20.81 kg/m   Physical Exam  Constitutional: He is oriented to person, place, and time. He appears well-developed and well-nourished. No distress.  HENT:  Head: Normocephalic and atraumatic.  Eyes: Conjunctivae are normal. Right eye exhibits no discharge. Left eye exhibits no discharge. No scleral icterus.  Cardiovascular: Normal rate.   Pulmonary/Chest: Effort normal.  Abdominal: Soft. He exhibits no distension. There is no tenderness.  Genitourinary:  Genitourinary Comments: Swollen, tender left inguinal lymphnode. No fluctuance. No erythema or warmth. No induration or drainage. No inguinal hernia. No testicular tenderness.   Neurological: He is alert and oriented to person, place, and time. Coordination normal.  Skin: Skin is warm and dry. No rash noted. He is not diaphoretic. No pallor.  4cm x 4cm area of erythema and warmth to left lower, outer leg. Superficial central wound noted. No fluctuance or drainage. Mild TTP.   Psychiatric:  He has a normal mood and affect. His behavior is normal.  Nursing note and vitals reviewed.    ED Treatments / Results  Labs (all labs ordered are listed, but only abnormal results are displayed) Labs Reviewed - No data to display  EKG  EKG Interpretation None       Radiology No results found.  Procedures Procedures (including critical care time)  Medications Ordered in ED Medications  ibuprofen (ADVIL,MOTRIN) tablet 600 mg (600 mg Oral Given 01/18/16 2011)  cephALEXin (KEFLEX) capsule 500 mg (500 mg Oral Given 01/18/16 2011)     Initial Impression / Assessment and Plan / ED Course  I have reviewed the triage vital signs and the nursing  notes.  Pertinent labs & imaging results that were available during my care of the patient were reviewed by me and considered in my medical decision making (see chart for details).  Clinical Course    Pt presents to the ED with likely reactive left inguinal lymphadenopathy secondary to area of cellulitis on LLE. Inguinal "lump" is non-erythematous, no warmth. No sign of hernia. Cellulitis is without abscess. Pt is afebrile. No sign of systemic infection. Will treat with Keflex. Discussed with pt that treatment of infection should also resolve lymphadenopathy once the infection is cleared. Return precautions outlined in patient discharge instructions.    Final Clinical Impressions(s) / ED Diagnoses   Final diagnoses:  Cellulitis of left lower extremity  Inguinal lymphadenopathy    New Prescriptions Discharge Medication List as of 01/18/2016  8:30 PM    START taking these medications   Details  cephALEXin (KEFLEX) 500 MG capsule Take 1 capsule (500 mg total) by mouth 4 (four) times daily., Starting Sun 01/18/2016, Print         Texas InstrumentsSamantha Tripp Shacoria Latif, PA-C 01/21/16 2231    Shaune Pollackameron Isaacs, MD 01/22/16 [phone]

## 2017-03-10 DIAGNOSIS — M79641 Pain in right hand: Secondary | ICD-10-CM | POA: Insufficient documentation

## 2017-03-10 DIAGNOSIS — M79642 Pain in left hand: Secondary | ICD-10-CM | POA: Insufficient documentation

## 2017-05-29 ENCOUNTER — Encounter (HOSPITAL_COMMUNITY): Payer: Self-pay

## 2017-05-29 ENCOUNTER — Emergency Department (HOSPITAL_COMMUNITY)
Admission: EM | Admit: 2017-05-29 | Discharge: 2017-05-29 | Disposition: A | Discharge status: Home / Self Care | Payer: Medicaid Other | Attending: Emergency Medicine | Admitting: Emergency Medicine

## 2017-05-29 DIAGNOSIS — K6289 Other specified diseases of anus and rectum: Secondary | ICD-10-CM

## 2017-05-29 DIAGNOSIS — F17210 Nicotine dependence, cigarettes, uncomplicated: Secondary | ICD-10-CM | POA: Diagnosis not present

## 2017-05-29 DIAGNOSIS — K644 Residual hemorrhoidal skin tags: Secondary | ICD-10-CM | POA: Insufficient documentation

## 2017-05-29 MED ORDER — LIDOCAINE 4 % EX CREA: 1.0000 "application " | TOPICAL_CREAM | CUTANEOUS | 0 refills | Status: DC | PRN

## 2017-05-29 MED ORDER — DOCUSATE SODIUM 100 MG PO CAPS: 100.0000 mg | ORAL_CAPSULE | Freq: Two times a day (BID) | ORAL | 0 refills | Status: DC

## 2017-05-29 MED ORDER — IBUPROFEN 800 MG PO TABS
800.0000 mg | ORAL_TABLET | Freq: Once | ORAL | Status: AC
  Administered 2017-05-29: 800 mg via ORAL
  Filled 2017-05-29: qty 1

## 2017-05-29 MED ORDER — HYDROCORTISONE ACE-PRAMOXINE 2.5-1 % RE CREA: 1.0000 "application " | TOPICAL_CREAM | Freq: Three times a day (TID) | RECTAL | 0 refills | Status: DC

## 2017-05-29 MED ORDER — IBUPROFEN 800 MG PO TABS: 800.0000 mg | ORAL_TABLET | Freq: Three times a day (TID) | ORAL | 0 refills | Status: DC | PRN

## 2017-05-29 NOTE — Discharge Instructions (Signed)
Return here as needed.  Also you will need to takes warm baths 3 times a day.  This will help shrink the size of the hemorrhoid.

## 2017-05-29 NOTE — ED Provider Notes (Signed)
[facility]-EMERGENCY DEPT Provider Note   CSN: [id number] Arrival date & time: 05/29/17  0212     History   Chief Complaint Chief Complaint  Patient presents with  . Rectal Pain    HPI Wesley Stewart is a 36 y.o. male.  HPI Patient presents to the emergency department with rectal pain that started yesterday.  The patient states that he has had some bleeding and swelling around the anal region.  Patient states that he has no known trauma to the area.  The patient's girlfriend states that the area is swollen.  Patient states that certain positions and palpation make the pain worse.  The patient states he did not take any medications prior to arrival.  The patient denies chest pain, shortness of breath, headache,blurred vision, neck pain, fever, cough, weakness, numbness, dizziness, anorexia, edema, abdominal pain, nausea, vomiting, diarrhea, rash, back pain, dysuria, hematemesis, near syncope, or syncope. Past Medical History:  Diagnosis Date  . Alcohol abuse   . Alcoholic gastritis     Patient Active Problem List   Diagnosis Date Noted  . 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 Alcohol abuse 03/02/2012  . Weakness of wrist 02/29/2012    Past Surgical History:  Procedure Laterality Date  . FOREARM SURGERY         Home Medications    Prior to Admission medications   Medication Sig Start Date End Date Taking? Authorizing Provider  pantoprazole (PROTONIX) 40 MG tablet Take 1 tablet (40 mg total) by mouth 2 (two) times daily before a meal. Patient not taking: Reported on 09/28/2015 01/27/15   Melene PlanFloyd, Dan, DO    Family History Family History  Problem Relation Age of Onset  . Stroke Unknown     Social History Social History   Tobacco Use  . Smoking status: Current Every Day Smoker    Packs/day: 0.50    Years: 15.00    Pack years: 7.50    Types: Cigarettes  . Smokeless tobacco: Never Used  Substance Use Topics  .  Alcohol use: Yes    Comment: occasion  . Drug use: Yes    Frequency: 3.0 times per week    Types: Marijuana     Allergies   Patient has no known allergies.   Review of Systems Review of Systems All other systems negative except as documented in the HPI. All pertinent positives and negatives as reviewed in the HPI.  Physical Exam Updated Vital Signs BP 133/88 (BP Location: Right Arm)   Pulse 75   Temp (!) 97.5 F (36.4 C) (Oral)   Resp 18   SpO2 100%   Physical Exam  Constitutional: He is oriented to person, place, and time. He appears well-developed and well-nourished. No distress.  HENT:  Head: Normocephalic and atraumatic.  Mouth/Throat: Oropharynx is clear and moist.  Eyes: Pupils are equal, round, and reactive to light.  Neck: Normal range of motion. Neck supple.  Cardiovascular: Normal rate, regular rhythm and normal heart sounds. Exam reveals no gallop and no friction rub.  No murmur heard. Pulmonary/Chest: Effort normal and breath sounds normal. No respiratory distress. He has no wheezes.  Abdominal: Soft. Bowel sounds are normal. He exhibits no distension. There is no tenderness. There is no guarding.  Genitourinary: Rectal exam shows internal hemorrhoid and tenderness.  Neurological: He is alert and oriented to person, place, and time. He exhibits normal muscle tone. Coordination normal.  Skin: Skin is warm and dry. Capillary refill takes  less than 2 seconds. No rash noted. No erythema.  Psychiatric: He has a normal mood and affect. His behavior is normal.  Nursing note and vitals reviewed.    ED Treatments / Results  Labs (all labs ordered are listed, but only abnormal results are displayed) Labs Reviewed - No data to display  EKG  EKG Interpretation None       Radiology No results found.  Procedures Procedures (including critical care time)  Medications Ordered in ED Medications - No data to display   Initial Impression / Assessment and  Plan / ED Course  I have reviewed the triage vital signs and the nursing notes.  Pertinent labs & imaging results that were available during my care of the patient were reviewed by me and considered in my medical decision making (see chart for details).     Patient has what appears to be swollen external hemorrhoids.  Patient will be given treatment for these.  I advised him that he will need to make sure that he does not strain to have bowel movements.  Patient will be given follow-up for these.  Final Clinical Impressions(s) / ED Diagnoses   Final diagnoses:  None    ED Discharge Orders    None       Charlestine NightLawyer, Banesa Tristan, PA-C 05/29/17 [id number]    Derwood KaplanNanavati, Ankit, MD 05/29/17 [phone]

## 2017-05-29 NOTE — ED Triage Notes (Signed)
Pt complains of rectal pain and bleeding since earlier today, he thinks it may be a hemorroid

## 2017-07-07 DIAGNOSIS — G5603 Carpal tunnel syndrome, bilateral upper limbs: Secondary | ICD-10-CM | POA: Insufficient documentation

## 2020-04-14 ENCOUNTER — Encounter (HOSPITAL_COMMUNITY): Payer: Self-pay

## 2020-04-14 ENCOUNTER — Other Ambulatory Visit: Payer: Self-pay

## 2020-04-14 ENCOUNTER — Emergency Department (HOSPITAL_COMMUNITY)
Admission: EM | Admit: 2020-04-14 | Discharge: 2020-04-15 | Disposition: A | Discharge status: Home / Self Care | Payer: Medicaid Other | Attending: Emergency Medicine | Admitting: Emergency Medicine

## 2020-04-14 DIAGNOSIS — K6289 Other specified diseases of anus and rectum: Secondary | ICD-10-CM | POA: Diagnosis present

## 2020-04-14 DIAGNOSIS — K623 Rectal prolapse: Secondary | ICD-10-CM | POA: Diagnosis not present

## 2020-04-14 DIAGNOSIS — F17210 Nicotine dependence, cigarettes, uncomplicated: Secondary | ICD-10-CM | POA: Insufficient documentation

## 2020-04-14 MED ORDER — ONDANSETRON HCL 4 MG/2ML IJ SOLN
4.0000 mg | Freq: Once | INTRAMUSCULAR | Status: AC
  Administered 2020-04-15: 4 mg via INTRAVENOUS
  Filled 2020-04-14: qty 2

## 2020-04-14 MED ORDER — MORPHINE SULFATE (PF) 4 MG/ML IV SOLN
4.0000 mg | Freq: Once | INTRAVENOUS | Status: AC
  Administered 2020-04-14: 4 mg via INTRAVENOUS
  Filled 2020-04-14: qty 1

## 2020-04-14 MED ORDER — LIDOCAINE HCL URETHRAL/MUCOSAL 2 % EX GEL
1.0000 "application " | Freq: Once | CUTANEOUS | Status: AC
  Administered 2020-04-15: 1 via TOPICAL
  Filled 2020-04-14: qty 11

## 2020-04-14 NOTE — ED Triage Notes (Signed)
Pt reports external hemorrhoid after sitting on commode.

## 2020-04-14 NOTE — ED Provider Notes (Signed)
[facility]-EMERGENCY DEPT Provider Note   CSN: [id number] Arrival date & time: 04/14/20  2232     History Chief Complaint  Patient presents with  . Hemorrhoids    Wesley Stewart is a 39 y.o. male.  HPI     This a 39 year old male who presents with concern for hemorrhoid.  Patient reports that he got home from work and had a bowel movement.  He states that the bowel movement was hard.  Since that time he has had protrusion from his rectal area and pain.  He applied an ointment with no relief.  He rates his pain at 10 out of 10.  He denies any blood in his stools.  Reports history of hemorrhoids in the past.  Denies abdominal pain, nausea, vomiting, fevers.  Past Medical History:  Diagnosis Date  . Alcohol abuse   . Alcoholic gastritis     Patient Active Problem List   Diagnosis Date Noted  . 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 Alcohol abuse 03/02/2012  . Weakness of wrist 02/29/2012    Past Surgical History:  Procedure Laterality Date  . FOREARM SURGERY         Family History  Problem Relation Age of Onset  . Stroke Other     Social History   Tobacco Use  . Smoking status: Current Every Day Smoker    Packs/day: 0.50    Years: 15.00    Pack years: 7.50    Types: Cigarettes  . Smokeless tobacco: Never Used  Substance Use Topics  . Alcohol use: Yes    Comment: occasion  . Drug use: Yes    Frequency: 3.0 times per week    Types: Marijuana    Home Medications Prior to Admission medications   Medication Sig Start Date End Date Taking? Authorizing Provider  ibuprofen (ADVIL,MOTRIN) 800 MG tablet Take 1 tablet (800 mg total) by mouth every 8 (eight) hours as needed. Patient taking differently: Take 800 mg by mouth every 8 (eight) hours as needed for moderate pain.  05/29/17  Yes Lawyer, Cristal Deer, PA-C  docusate sodium (COLACE) 100 MG capsule Take 1 capsule (100 mg total) by mouth every 12 (twelve) hours.  04/15/20   Elide Stalzer, Mayer Masker, MD  hydrocortisone-pramoxine [facility]) 2.5-1 % rectal cream Place 1 application rectally 3 (three) times daily. Patient not taking: Reported on 04/15/2020 05/29/17   Charlestine Night, PA-C  lidocaine (LMX) 4 % cream Apply 1 application topically as needed. 04/15/20   Mallery Harshman, Mayer Masker, MD  pantoprazole (PROTONIX) 40 MG tablet Take 1 tablet (40 mg total) by mouth 2 (two) times daily before a meal. Patient not taking: Reported on 09/28/2015 01/27/15   Melene Plan, DO  polyethylene glycol (MIRALAX) 17 g packet Take 17 g by mouth daily. 04/15/20   Lucyann Romano, Mayer Masker, MD    Allergies    Patient has no known allergies.  Review of Systems   Review of Systems  Constitutional: Negative for fever.  Respiratory: Negative for shortness of breath.   Cardiovascular: Negative for chest pain.  Gastrointestinal: Positive for rectal pain. Negative for abdominal pain, anal bleeding, blood in stool, nausea and vomiting.  Genitourinary: Negative for dysuria.  All other systems reviewed and are negative.   Physical Exam Updated Vital Signs BP (!) 147/99   Pulse 76   Temp 98 F (36.7 C) (Oral)   Resp 14   Ht 1.778 m (5\' 10" )   Wt 74.8 kg   SpO2  94%   BMI 23.68 kg/m   Physical Exam Vitals and nursing note reviewed.  Constitutional:      Appearance: He is well-developed. He is not ill-appearing.  HENT:     Head: Normocephalic and atraumatic.     Mouth/Throat:     Mouth: Mucous membranes are moist.  Eyes:     Pupils: Pupils are equal, round, and reactive to light.  Cardiovascular:     Rate and Rhythm: Normal rate and regular rhythm.  Pulmonary:     Effort: Pulmonary effort is normal. No respiratory distress.  Abdominal:     Palpations: Abdomen is soft.     Tenderness: There is no abdominal tenderness.  Genitourinary:    Comments: Rectal exam with evidence of prolapsed rectal mucosa approximately 1 to 2 cm, there is also an adjacent hemorrhoid that is  nonthrombosed, no active bleeding Musculoskeletal:     Cervical back: Neck supple.     Right lower leg: No edema.     Left lower leg: No edema.  Lymphadenopathy:     Cervical: No cervical adenopathy.  Skin:    General: Skin is warm and dry.  Neurological:     Mental Status: He is alert and oriented to person, place, and time.  Psychiatric:        Mood and Affect: Mood normal.     ED Results / Procedures / Treatments   Labs (all labs ordered are listed, but only abnormal results are displayed) Labs Reviewed - No data to display  EKG None  Radiology No results found.  Procedures Procedures (including critical care time)  Medications Ordered in ED Medications  lidocaine (XYLOCAINE) 2 % jelly 1 application (1 application Topical Given 04/15/20 0002)  morphine 4 MG/ML injection 4 mg (4 mg Intravenous Given 04/14/20 2359)  ondansetron (ZOFRAN) injection 4 mg (4 mg Intravenous Given 04/15/20 0000)    ED Course  I have reviewed the triage vital signs and the nursing notes.  Pertinent labs & imaging results that were available during my care of the patient were reviewed by me and considered in my medical decision making (see chart for details).  Clinical Course as of Apr 15 230  Tue Apr 15, 2020  0227 Patient with slight involution of prolapse with application of sugar for osmotic shift.  He does not tolerate attempts at manual reduction even after lidocaine jelly and IV pain medication.  Prolapse on last recheck less than 1 cm.   [CH]    Clinical Course User Index [CH] Artemis Loyal, Mayer Masker, MD   MDM Rules/Calculators/A&P                          Patient presents with bulging and pain in the rectum.  On physical exam he has evidence of mucosal prolapse.  He is exquisitely tender.  He was given pain medication and topical lidocaine.  Sugar was applied for osmotic action to reduce swelling.  On multiple rechecks, he did have subsequent reduction in mucosal surface area reduced  to less than 1 cm protrusion.  He did not tolerate further manual reduction following topical pain medication and IV pain medication.  I discussed with him that this will likely continue to reduce with osmotic gradient as long as he avoids rectal pressure and constipation.  He will need follow-up with general surgery.  He will be prescribed Colace, MiraLAX, and lidocaine.  He was instructed if he notes worsening prolapse or bulging, he should be  reevaluated.  General surgery follow-up provided.  After history, exam, and medical workup I feel the patient has been appropriately medically screened and is safe for discharge home. Pertinent diagnoses were discussed with the patient. Patient was given return precautions.  Final Clinical Impression(s) / ED Diagnoses Final diagnoses:  Rectal prolapse    Rx / DC Orders ED Discharge Orders         Ordered    docusate sodium (COLACE) 100 MG capsule  Every 12 hours        04/15/20 0229    lidocaine (LMX) 4 % cream  As needed        04/15/20 0229    polyethylene glycol (MIRALAX) 17 g packet  Daily        04/15/20 0229           Shon Baton, MD 04/15/20 0234

## 2020-04-15 MED ORDER — LIDOCAINE 4 % EX CREA: 1.0000 "application " | TOPICAL_CREAM | CUTANEOUS | 0 refills | Status: AC | PRN

## 2020-04-15 MED ORDER — DOCUSATE SODIUM 100 MG PO CAPS: 100.0000 mg | ORAL_CAPSULE | Freq: Two times a day (BID) | ORAL | 0 refills | Status: DC

## 2020-04-15 MED ORDER — POLYETHYLENE GLYCOL 3350 17 G PO PACK: 17.0000 g | PACK | Freq: Every day | ORAL | 0 refills | Status: DC

## 2020-04-15 NOTE — Discharge Instructions (Addendum)
You were seen today and found to have a small rectal prolapse.  It is very important that you avoid hard stools and constipation.  Take Colace and MiraLAX daily.  Avoid straining.  You ultimately will need to follow-up with general surgery if prolapse persists.  If you note increasing bulging at the anus, you should be reevaluated.

## 2020-05-04 ENCOUNTER — Other Ambulatory Visit: Payer: Self-pay

## 2020-05-04 ENCOUNTER — Emergency Department (HOSPITAL_COMMUNITY)
Admission: EM | Admit: 2020-05-04 | Discharge: 2020-05-05 | Disposition: A | Discharge status: Home / Self Care | Payer: Medicaid Other | Attending: Emergency Medicine | Admitting: Emergency Medicine

## 2020-05-04 ENCOUNTER — Encounter (HOSPITAL_COMMUNITY): Payer: Self-pay | Admitting: Emergency Medicine

## 2020-05-04 DIAGNOSIS — K623 Rectal prolapse: Secondary | ICD-10-CM | POA: Diagnosis not present

## 2020-05-04 DIAGNOSIS — K649 Unspecified hemorrhoids: Secondary | ICD-10-CM | POA: Diagnosis not present

## 2020-05-04 DIAGNOSIS — F17210 Nicotine dependence, cigarettes, uncomplicated: Secondary | ICD-10-CM | POA: Insufficient documentation

## 2020-05-04 DIAGNOSIS — F1590 Other stimulant use, unspecified, uncomplicated: Secondary | ICD-10-CM | POA: Diagnosis not present

## 2020-05-04 DIAGNOSIS — K6289 Other specified diseases of anus and rectum: Secondary | ICD-10-CM

## 2020-05-04 MED ORDER — FENTANYL CITRATE (PF) 100 MCG/2ML IJ SOLN
INTRAMUSCULAR | Status: AC
  Administered 2020-05-04: 75 ug via INTRAVENOUS
  Filled 2020-05-04: qty 2

## 2020-05-04 MED ORDER — FENTANYL CITRATE (PF) 100 MCG/2ML IJ SOLN
75.0000 ug | Freq: Once | INTRAMUSCULAR | Status: AC
  Administered 2020-05-04: 75 ug via INTRAVENOUS

## 2020-05-04 MED ORDER — LIDOCAINE HCL URETHRAL/MUCOSAL 2 % EX GEL
1.0000 "application " | Freq: Once | CUTANEOUS | Status: AC
  Administered 2020-05-04: 1 via TOPICAL
  Filled 2020-05-04: qty 11

## 2020-05-04 MED ORDER — FENTANYL CITRATE (PF) 100 MCG/2ML IJ SOLN: 75.0000 ug | Freq: Once | INTRAMUSCULAR | Status: AC

## 2020-05-04 MED ORDER — DEXTROSE 50 % IV SOLN
INTRAVENOUS | Status: AC
  Administered 2020-05-04: 50 mL
  Filled 2020-05-04: qty 50

## 2020-05-04 MED ORDER — LIDOCAINE 4 % EX CREA: 1.0000 "application " | TOPICAL_CREAM | CUTANEOUS | 0 refills | Status: AC | PRN

## 2020-05-04 MED ORDER — FENTANYL CITRATE (PF) 100 MCG/2ML IJ SOLN
50.0000 ug | Freq: Once | INTRAMUSCULAR | Status: AC
  Administered 2020-05-04: 22:00:00 50 ug via INTRAVENOUS
  Filled 2020-05-04: qty 2

## 2020-05-04 MED ORDER — SENNOSIDES-DOCUSATE SODIUM 8.6-50 MG PO TABS: 1.0000 | ORAL_TABLET | Freq: Every day | ORAL | 1 refills | Status: AC

## 2020-05-04 MED ORDER — FENTANYL CITRATE (PF) 100 MCG/2ML IJ SOLN
INTRAMUSCULAR | Status: AC
  Filled 2020-05-04: qty 2

## 2020-05-04 MED ORDER — HYDROCORTISONE ACETATE 25 MG RE SUPP: 25.0000 mg | Freq: Two times a day (BID) | RECTAL | 0 refills | Status: AC

## 2020-05-04 NOTE — ED Provider Notes (Signed)
[facility]-EMERGENCY DEPT Provider Note   CSN: [id number] Arrival date & time: 05/04/20  2015     History Chief Complaint  Patient presents with  . Rectal Pain    Wesley Stewart is a 39 y.o. male presented emerge department rectal pain.  He has a history of rectal prolapse that was diagnosed 3 weeks ago in the emergency department, with failed attempt to reduce the prolapse in the ED.  He was discharged home and reports that over the past several days, the prolapse is gone in and out, often coming out with straining on the toilet.  Today the prolapse came out again and has not reduced.  It is very painful.  He has not seen a Careers adviser for this.  No vomiting NKDA  HPI     Past Medical History:  Diagnosis Date  . Alcohol abuse   . Alcoholic gastritis     Patient Active Problem List   Diagnosis Date Noted  . 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 Alcohol abuse 03/02/2012  . Weakness of wrist 02/29/2012    Past Surgical History:  Procedure Laterality Date  . FOREARM SURGERY         Family History  Problem Relation Age of Onset  . Stroke Other     Social History   Tobacco Use  . Smoking status: Current Every Day Smoker    Packs/day: 0.50    Years: 15.00    Pack years: 7.50    Types: Cigarettes  . Smokeless tobacco: Never Used  Substance Use Topics  . Alcohol use: Yes    Comment: occasion  . Drug use: Yes    Frequency: 3.0 times per week    Types: Marijuana    Home Medications Prior to Admission medications   Medication Sig Start Date End Date Taking? Authorizing Provider  docusate sodium (COLACE) 100 MG capsule Take 1 capsule (100 mg total) by mouth every 12 (twelve) hours. 04/15/20  Yes Horton, Mayer Masker, MD  lidocaine (LMX) 4 % cream Apply 1 application topically as needed. 04/15/20  Yes Horton, Mayer Masker, MD  polyethylene glycol (MIRALAX) 17 g packet Take 17 g by mouth daily. 04/15/20  Yes Horton, Mayer Masker, MD  hydrocortisone (ANUSOL-HC) 25 MG suppository Place 1 suppository (25 mg total) rectally 2 (two) times daily for 24 doses. 05/04/20 05/16/20  Terald Sleeper, MD  lidocaine (LMX) 4 % cream Apply 1 application topically as needed. Apply pea-size amount to suppository before inserting in anus 05/04/20   Terald Sleeper, MD  senna-docusate (SENOKOT-S) 8.6-50 MG tablet Take 1 tablet by mouth daily. 05/04/20 07/03/20  Terald Sleeper, MD  pantoprazole (PROTONIX) 40 MG tablet Take 1 tablet (40 mg total) by mouth 2 (two) times daily before a meal. Patient not taking: Reported on 09/28/2015 01/27/15 05/05/20  Melene Plan, DO    Allergies    Patient has no known allergies.  Review of Systems   Review of Systems  Constitutional: Negative for chills and fever.  Respiratory: Negative for cough and shortness of breath.   Cardiovascular: Negative for chest pain and palpitations.  Gastrointestinal: Positive for rectal pain. Negative for abdominal pain and vomiting.  Musculoskeletal: Negative for arthralgias and myalgias.  Skin: Negative for color change and rash.  Neurological: Negative for syncope and headaches.  Psychiatric/Behavioral: Negative for agitation and confusion.  All other systems reviewed and are negative.   Physical Exam Updated Vital Signs BP (!) 149/94 (BP Location:  Right Arm)   Pulse 61   Temp 97.8 F (36.6 C) (Oral)   Resp 18   Ht 5\' 10"  (1.778 m)   Wt 74.8 kg   SpO2 99%   BMI 23.68 kg/m   Physical Exam Vitals and nursing note reviewed.  Constitutional:      General: He is in acute distress.     Appearance: He is well-developed.  HENT:     Head: Normocephalic and atraumatic.  Eyes:     Conjunctiva/sclera: Conjunctivae normal.  Cardiovascular:     Rate and Rhythm: Normal rate and regular rhythm.  Pulmonary:     Effort: Pulmonary effort is normal. No respiratory distress.  Genitourinary:    Comments: Rectal prolapse, tender External hemorrhoids noted See  photos Musculoskeletal:     Cervical back: Neck supple.  Skin:    General: Skin is warm and dry.  Neurological:     General: No focal deficit present.     Mental Status: He is alert and oriented to person, place, and time.  Psychiatric:        Mood and Affect: Mood normal.        Behavior: Behavior normal.          ED Results / Procedures / Treatments   Labs (all labs ordered are listed, but only abnormal results are displayed) Labs Reviewed  URINALYSIS, ROUTINE W REFLEX MICROSCOPIC - Abnormal; Notable for the following components:      Result Value   Color, Urine STRAW (*)    All other components within normal limits    EKG None  Radiology No results found.  Procedures Procedures (including critical care time)  Medications Ordered in ED Medications  fentaNYL (SUBLIMAZE) injection 50 mcg (50 mcg Intravenous Given 05/04/20 2204)  lidocaine (XYLOCAINE) 2 % jelly 1 application (1 application Topical Given 05/04/20 2224)  dextrose 50 % solution (50 mLs  Given 05/04/20 2208)  fentaNYL (SUBLIMAZE) injection 75 mcg (75 mcg Intravenous Given 05/04/20 2216)  fentaNYL (SUBLIMAZE) injection 75 mcg (75 mcg Intravenous Given 05/04/20 2223)    ED Course  I have reviewed the triage vital signs and the nursing notes.  Pertinent labs & imaging results that were available during my care of the patient were reviewed by me and considered in my medical decision making (see chart for details).  39 yo male presenting to ED with rectal prolapse, likely complicated by some hemorrhoids.  We were able to reduce this at bedside applying sugar, ice, and lidocaine jelly, with pressure.  IV fentanyl given for pain.  We talked about home management at length and in discharge instructions.    We are waiting for him to void, as he was not able to do so earlier.  Signed out to overnight team with plan for discharge once he urinates.  Clinical Course as of May 06 1219  05-10-1997 May 04, 2020  2227  Successful reduction of prolapse, appears to have external hemorrhoids as well.  We discussed mgmt options.  He needs to have a void trial today as he's been unable to urinate all day today - likely from pain and bladder spasm   [MT]    Clinical Course User Index [MT] Tamiki Kuba, May 06, 2020, MD    Final Clinical Impression(s) / ED Diagnoses Final diagnoses:  Rectal pain  Rectal prolapse  Hemorrhoids, unspecified hemorrhoid type    Rx / DC Orders ED Discharge Orders         Ordered    hydrocortisone (ANUSOL-HC) 25  MG suppository  2 times daily        05/04/20 2239    lidocaine (LMX) 4 % cream  As needed       Note to Pharmacy: If too expensive, can offer any 2% or 4% over the counter lidocaine formulary (eg [employer]).   05/04/20 2239    senna-docusate (SENOKOT-S) 8.6-50 MG tablet  Daily        05/04/20 2239           Terald Sleeper, MD 05/05/20 1220

## 2020-05-04 NOTE — ED Triage Notes (Signed)
Patient c/o rectal pain with "bulge" x3 hours. Hx prolapse.

## 2020-05-04 NOTE — Discharge Instructions (Addendum)
Instructions   You have a prolapse and hemorrhoids in your rectum.  These can be very painful.  Follow these instructions at home.  Call [facility] tomorrow and ask if your December appointment can be moved up any further.  Tell them you were seen again in the ER. You need to soak your bottom in warm water (in your bathtub) two or three times per day, for 10 minutes.  You can do this after you poop if you are sore.     For  your hemorrhoids, you can put these suppository pills in your rectum at least once per day (twice per day if possible).  You can smear some lidocaine jelly (that I prescribed) on the pill as lubricant.  It will help numb your bottom. Avoid sitting on your bottom for long periods of time.  Avoid heavy straining.  Try to lay on your stomach as much as possible at home. Start taking the stool softener (peri-colace) I prescribed.  Buy some fiber pills over the counter at the pharmacy and add this to your daily diet.  Avoid constipation.   If your prolapse comes out again and gets stuck, pour some regular sugar on it and put a small bag of ice on it.  Lay on your stomach with this for 10 minutes.  Then use your hand to put slow, steady pressure on it to get it back in.  If all else fails you should return to the ER.

## 2020-05-05 LAB — URINALYSIS, ROUTINE W REFLEX MICROSCOPIC
Bilirubin Urine: NEGATIVE
Glucose, UA: NEGATIVE mg/dL
Hgb urine dipstick: NEGATIVE
Ketones, ur: NEGATIVE mg/dL
Leukocytes,Ua: NEGATIVE
Nitrite: NEGATIVE
Protein, ur: NEGATIVE mg/dL
Specific Gravity, Urine: 1.019 (ref 1.005–1.030)
pH: 6 (ref 5.0–8.0)

## 2020-05-05 NOTE — ED Provider Notes (Signed)
Nursing notes and vitals signs, including pulse oximetry, reviewed.  Summary of this visit's results, reviewed by myself:  EKG:  EKG Interpretation  Date/Time:    Ventricular Rate:    PR Interval:    QRS Duration:   QT Interval:    QTC Calculation:   R Axis:     Text Interpretation:         Labs:  Results for orders placed or performed during the hospital encounter of 05/04/20 (from the past 24 hour(s))  Urinalysis, Routine w reflex microscopic Urine, Clean Catch     Status: Abnormal   Collection Time: 05/04/20 11:53 PM  Result Value Ref Range   Color, Urine STRAW (A) YELLOW   APPearance CLEAR CLEAR   Specific Gravity, Urine 1.019 1.005 - 1.030   pH 6.0 5.0 - 8.0   Glucose, UA NEGATIVE NEGATIVE mg/dL   Hgb urine dipstick NEGATIVE NEGATIVE   Bilirubin Urine NEGATIVE NEGATIVE   Ketones, ur NEGATIVE NEGATIVE mg/dL   Protein, ur NEGATIVE NEGATIVE mg/dL   Nitrite NEGATIVE NEGATIVE   Leukocytes,Ua NEGATIVE NEGATIVE    Imaging Studies: No results found.    Merisa Julio, MD 05/05/20 0100

## 2020-09-27 ENCOUNTER — Other Ambulatory Visit: Payer: Self-pay

## 2020-09-27 ENCOUNTER — Encounter (HOSPITAL_COMMUNITY): Payer: Self-pay

## 2020-09-27 ENCOUNTER — Emergency Department (HOSPITAL_COMMUNITY)
Admission: EM | Admit: 2020-09-27 | Discharge: 2020-09-27 | Disposition: A | Discharge status: Home / Self Care | Payer: Medicaid Other | Attending: Emergency Medicine | Admitting: Emergency Medicine

## 2020-09-27 DIAGNOSIS — K625 Hemorrhage of anus and rectum: Secondary | ICD-10-CM | POA: Diagnosis not present

## 2020-09-27 DIAGNOSIS — F17210 Nicotine dependence, cigarettes, uncomplicated: Secondary | ICD-10-CM | POA: Insufficient documentation

## 2020-09-27 DIAGNOSIS — K623 Rectal prolapse: Secondary | ICD-10-CM | POA: Insufficient documentation

## 2020-09-27 MED ORDER — SODIUM CHLORIDE 0.9 % IV BOLUS
1000.0000 mL | Freq: Once | INTRAVENOUS | Status: AC
  Administered 2020-09-27: 1000 mL via INTRAVENOUS

## 2020-09-27 MED ORDER — ONDANSETRON HCL 4 MG/2ML IJ SOLN
4.0000 mg | Freq: Once | INTRAMUSCULAR | Status: AC
  Administered 2020-09-27: 4 mg via INTRAVENOUS
  Filled 2020-09-27: qty 2

## 2020-09-27 MED ORDER — GLUCOSE 40 % PO GEL
1.0000 | Freq: Once | ORAL | Status: AC
  Administered 2020-09-27: 37.5 g via ORAL
  Filled 2020-09-27: qty 1

## 2020-09-27 MED ORDER — LIDOCAINE HCL URETHRAL/MUCOSAL 2 % EX GEL
1.0000 "application " | Freq: Once | CUTANEOUS | Status: AC
  Administered 2020-09-27: 1 via TOPICAL
  Filled 2020-09-27: qty 11

## 2020-09-27 MED ORDER — PROPOFOL 10 MG/ML IV BOLUS
1.0000 mg/kg | Freq: Once | INTRAVENOUS | Status: AC
  Administered 2020-09-27: 64.9 mg via INTRAVENOUS
  Filled 2020-09-27: qty 20

## 2020-09-27 MED ORDER — HYDROMORPHONE HCL 1 MG/ML IJ SOLN
1.0000 mg | Freq: Once | INTRAMUSCULAR | Status: AC
  Administered 2020-09-27: 1 mg via INTRAVENOUS
  Filled 2020-09-27: qty 1

## 2020-09-27 MED ORDER — PROPOFOL 10 MG/ML IV BOLUS
INTRAVENOUS | Status: AC | PRN
  Administered 2020-09-27: 65 mg via INTRAVENOUS

## 2020-09-27 NOTE — ED Triage Notes (Signed)
Pt reports that his rectum prolapsed. Pt reports hx of this.

## 2020-09-27 NOTE — ED Notes (Signed)
Patient left before the discharge process could be completed. He did however get his discharge papers.

## 2020-09-27 NOTE — ED Provider Notes (Signed)
[facility]-EMERGENCY DEPT Provider Note   CSN: [id number] Arrival date & time: 09/27/20  1420     History Chief Complaint  Patient presents with  . Rectal Pain    Wesley Stewart is a 40 y.o. male.  The patient has been to the emergency department 3 times for this same issue.  He states that he has not followed up with surgery because he has been working.  He does have health insurance.  The history is provided by the patient.  Rectal Bleeding Quality:  Bright red Amount:  Scant Duration:  1 day Timing:  Intermittent Chronicity:  New Context comment:  Developed rectal prolapse yesterday after lifting a heavy object Similar prior episodes: yes   Relieved by:  Nothing Worsened by:  Nothing Ineffective treatments: reducing it himself. Associated symptoms: no abdominal pain, no fever and no vomiting        Past Medical History:  Diagnosis Date  . Alcohol abuse   . Alcoholic gastritis     Patient Active Problem List   Diagnosis Date Noted  . 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 Alcohol abuse 03/02/2012  . Weakness of wrist 02/29/2012    Past Surgical History:  Procedure Laterality Date  . FOREARM SURGERY         Family History  Problem Relation Age of Onset  . Stroke Other     Social History   Tobacco Use  . Smoking status: Current Every Day Smoker    Packs/day: 0.50    Years: 15.00    Pack years: 7.50    Types: Cigarettes  . Smokeless tobacco: Never Used  Substance Use Topics  . Alcohol use: Yes    Comment: occasion  . Drug use: Yes    Frequency: 3.0 times per week    Types: Marijuana    Home Medications Prior to Admission medications   Medication Sig Start Date End Date Taking? Authorizing Provider  docusate sodium (COLACE) 100 MG capsule Take 1 capsule (100 mg total) by mouth every 12 (twelve) hours. 04/15/20   Horton, Mayer Masker, MD  lidocaine (LMX) 4 % cream Apply 1 application topically as  needed. 04/15/20   Horton, Mayer Masker, MD  lidocaine (LMX) 4 % cream Apply 1 application topically as needed. Apply pea-size amount to suppository before inserting in anus 05/04/20   Trifan, Kermit Balo, MD  polyethylene glycol (MIRALAX) 17 g packet Take 17 g by mouth daily. 04/15/20   Horton, Mayer Masker, MD  pantoprazole (PROTONIX) 40 MG tablet Take 1 tablet (40 mg total) by mouth 2 (two) times daily before a meal. Patient not taking: Reported on 09/28/2015 01/27/15 05/05/20  Melene Plan, DO    Allergies    Patient has no known allergies.  Review of Systems   Review of Systems  Constitutional: Negative for chills and fever.  HENT: Negative for ear pain and sore throat.   Eyes: Negative for pain and visual disturbance.  Respiratory: Negative for cough and shortness of breath.   Cardiovascular: Negative for chest pain and palpitations.  Gastrointestinal: Positive for hematochezia. Negative for abdominal pain and vomiting.  Genitourinary: Negative for dysuria and hematuria.  Musculoskeletal: Negative for arthralgias and back pain.  Skin: Negative for color change and rash.  Neurological: Negative for seizures and syncope.  All other systems reviewed and are negative.   Physical Exam Updated Vital Signs BP 116/61 (BP Location: Left Arm)   Pulse 60   Temp 97.6 F (36.4  C) (Oral)   Resp 16   SpO2 99%   Physical Exam Vitals and nursing note reviewed.  Constitutional:      Appearance: Normal appearance.  HENT:     Head: Normocephalic and atraumatic.  Eyes:     Conjunctiva/sclera: Conjunctivae normal.  Pulmonary:     Effort: Pulmonary effort is normal. No respiratory distress.  Genitourinary:    Comments: Obvious rectal prolapse.  Mucosa is inflamed, and there is scant bright red blood on the exposed mucosa Musculoskeletal:        General: No deformity. Normal range of motion.     Cervical back: Normal range of motion.  Skin:    General: Skin is warm and dry.  Neurological:      General: No focal deficit present.     Mental Status: He is alert and oriented to person, place, and time. Mental status is at baseline.  Psychiatric:        Mood and Affect: Mood normal.     ED Results / Procedures / Treatments   Labs (all labs ordered are listed, but only abnormal results are displayed) Labs Reviewed - No data to display  EKG None  Radiology No results found.  Procedures .Sedation  Date/Time: 09/27/2020 4:40 PM Performed by: Koleen Distance, MD Authorized by: Koleen Distance, MD   Consent:    Consent obtained:  Written   Consent given by:  Patient   Risks discussed:  Allergic reaction, dysrhythmia, inadequate sedation, nausea, prolonged hypoxia resulting in organ damage, prolonged sedation necessitating reversal, respiratory compromise necessitating ventilatory assistance and intubation and vomiting   Alternatives discussed:  Analgesia without sedation and anxiolysis Universal protocol:    Procedure explained and questions answered to patient or proxy's satisfaction: yes     Immediately prior to procedure, a time out was called: yes     Patient identity confirmed:  Verbally with patient Indications:    Sedation is required to allow for: reduction of rectal prolapse; failed under topical and parenteral meds.   Procedure necessitating sedation performed by:  Physician performing sedation Pre-sedation assessment:    Time since last food or drink:  12 hours   ASA classification: class 1 - normal, healthy patient     Mouth opening:  3 or more finger widths   Thyromental distance:  4 finger widths   Mallampati score:  I - soft palate, uvula, fauces, pillars visible   Pre-sedation assessments completed and reviewed: airway patency, cardiovascular function, hydration status, mental status, nausea/vomiting, pain level, respiratory function and temperature     Pre-sedation assessment completed:  09/27/2020 4:00 PM Immediate pre-procedure details:    Reviewed: vital  signs, relevant labs/tests and NPO status     Verified: bag valve mask available, emergency equipment available, intubation equipment available, IV patency confirmed, oxygen available and suction available   Procedure details (see MAR for exact dosages):    Preoxygenation:  Nasal cannula   Sedation:  Propofol   Intended level of sedation: moderate (conscious sedation)   Analgesia:  None   Intra-procedure monitoring:  Blood pressure monitoring, cardiac monitor, continuous capnometry, continuous pulse oximetry, frequent LOC assessments and frequent vital sign checks   Intra-procedure events: none     Total Provider sedation time (minutes):  10 Post-procedure details:    Post-sedation assessment completed:  09/27/2020 4:42 PM   Attendance: Constant attendance by certified staff until patient recovered     Recovery: Patient returned to pre-procedure baseline     Post-sedation assessments completed and  reviewed: airway patency, cardiovascular function, hydration status, mental status, nausea/vomiting, pain level, respiratory function and temperature     Patient is stable for discharge or admission: yes     Procedure completion:  Tolerated well, no immediate complications     Medications Ordered in ED Medications  HYDROmorphone (DILAUDID) injection 1 mg (1 mg Intravenous Given 09/27/20 1528)  ondansetron (ZOFRAN) injection 4 mg (4 mg Intravenous Given 09/27/20 1533)  lidocaine (XYLOCAINE) 2 % jelly 1 application (1 application Topical Given 09/27/20 1533)  dextrose (GLUTOSE) 40 % oral gel 37.5 g (37.5 g Oral Given 09/27/20 1533)  sodium chloride 0.9 % bolus 1,000 mL (1,000 mLs Intravenous New Bag/Given 09/27/20 1625)  propofol (DIPRIVAN) 10 mg/mL bolus/IV push 64.9 mg (64.9 mg Intravenous Given 09/27/20 1625)  propofol (DIPRIVAN) 10 mg/mL bolus/IV push (65 mg Intravenous Given 09/27/20 1645)    ED Course  I have reviewed the triage vital signs and the nursing notes.  Pertinent labs & imaging  results that were available during my care of the patient were reviewed by me and considered in my medical decision making (see chart for details).  Clinical Course as of 09/27/20 1844  Sat Sep 27, 2020  1733 Still a bit sleepy but is able to tolerate PO. [AW]    Clinical Course User Index [AW] Koleen Distance, MD   MDM Rules/Calculators/A&P                          Denver Faster presented with rectal prolapse.  It was successfully reduced in the ED.  He was counseled to follow-up with surgery.  The same instruction has been repeated on the other 2 ED visits, and I stressed the importance of this.  The patient was also told to maintain regular bowel habits.  He should make sure that he is having soft bowel movements. Final Clinical Impression(s) / ED Diagnoses Final diagnoses:  Rectal prolapse    Rx / DC Orders ED Discharge Orders    None       Koleen Distance, MD 09/27/20 [phone]

## 2020-09-29 ENCOUNTER — Emergency Department (HOSPITAL_COMMUNITY)
Admission: EM | Admit: 2020-09-29 | Discharge: 2020-09-29 | Disposition: A | Discharge status: Home / Self Care | Payer: Medicaid Other | Attending: Emergency Medicine | Admitting: Emergency Medicine

## 2020-09-29 ENCOUNTER — Other Ambulatory Visit: Payer: Self-pay

## 2020-09-29 ENCOUNTER — Encounter (HOSPITAL_COMMUNITY): Payer: Self-pay | Admitting: Emergency Medicine

## 2020-09-29 DIAGNOSIS — R110 Nausea: Secondary | ICD-10-CM | POA: Diagnosis not present

## 2020-09-29 DIAGNOSIS — F17210 Nicotine dependence, cigarettes, uncomplicated: Secondary | ICD-10-CM | POA: Insufficient documentation

## 2020-09-29 DIAGNOSIS — K623 Rectal prolapse: Secondary | ICD-10-CM | POA: Diagnosis not present

## 2020-09-29 DIAGNOSIS — R1084 Generalized abdominal pain: Secondary | ICD-10-CM | POA: Insufficient documentation

## 2020-09-29 DIAGNOSIS — K648 Other hemorrhoids: Secondary | ICD-10-CM

## 2020-09-29 DIAGNOSIS — K6289 Other specified diseases of anus and rectum: Secondary | ICD-10-CM | POA: Diagnosis present

## 2020-09-29 MED ORDER — FENTANYL CITRATE (PF) 100 MCG/2ML IJ SOLN
50.0000 ug | Freq: Once | INTRAMUSCULAR | Status: AC
  Administered 2020-09-29: 50 ug via INTRAVENOUS
  Filled 2020-09-29: qty 2

## 2020-09-29 MED ORDER — ONDANSETRON HCL 4 MG/2ML IJ SOLN
4.0000 mg | Freq: Once | INTRAMUSCULAR | Status: AC
  Administered 2020-09-29: 4 mg via INTRAVENOUS
  Filled 2020-09-29: qty 2

## 2020-09-29 MED ORDER — DOCUSATE SODIUM 100 MG PO CAPS: 100.0000 mg | ORAL_CAPSULE | Freq: Two times a day (BID) | ORAL | 2 refills | Status: AC

## 2020-09-29 MED ORDER — KETOROLAC TROMETHAMINE 30 MG/ML IJ SOLN
30.0000 mg | Freq: Once | INTRAMUSCULAR | Status: AC
  Administered 2020-09-29: 30 mg via INTRAVENOUS
  Filled 2020-09-29: qty 1

## 2020-09-29 MED ORDER — POLYETHYLENE GLYCOL 3350 17 G PO PACK: 17.0000 g | PACK | Freq: Two times a day (BID) | ORAL | 0 refills | Status: AC

## 2020-09-29 MED ORDER — DOCUSATE SODIUM 100 MG PO CAPS: 100.0000 mg | ORAL_CAPSULE | Freq: Two times a day (BID) | ORAL | 0 refills | Status: DC

## 2020-09-29 NOTE — ED Provider Notes (Addendum)
Physical Exam  BP (!) 162/93 (BP Location: Left Arm)   Pulse (!) 59   Temp (!) 97.4 F (36.3 C) (Oral)   Resp 16   SpO2 100%   Physical Exam Vitals and nursing note reviewed. Exam conducted with a chaperone present (Male techincian, male Charity fundraiser).  HENT:     Head: Normocephalic and atraumatic.     Mouth/Throat:     Mouth: Mucous membranes are moist.     Pharynx: No oropharyngeal exudate or posterior oropharyngeal erythema.  Eyes:     General:        Right eye: No discharge.        Left eye: No discharge.     Conjunctiva/sclera: Conjunctivae normal.  Cardiovascular:     Rate and Rhythm: Normal rate and regular rhythm.     Pulses: Normal pulses.  Pulmonary:     Effort: Pulmonary effort is normal. No respiratory distress.     Breath sounds: Normal breath sounds. No wheezing or rales.  Abdominal:     General: Bowel sounds are normal. There is no distension.     Tenderness: There is no abdominal tenderness.  Genitourinary:   Musculoskeletal:        General: No deformity.     Cervical back: Neck supple.  Skin:    General: Skin is warm and dry.  Neurological:     Mental Status: He is alert. Mental status is at baseline.  Psychiatric:        Mood and Affect: Mood normal.     ED Course/Procedures     Procedures  MDM   Care of this patient assumed from preceding ED provider Graciella Freer, PA-C at time of shift change.  Please see her associated note for further insight this patient's ED course.  In brief patient with extensive history of rectal prolapse who was seen here 48 hours ago for concern of rectal pain.  Prolapse was reduced at that time.  Presents today with complaint of rectal pain once again; states that he went to have a bowel movement last night and feels that he has prolapse once again.  He has never followed with surgery though has been recommended to him prior to today.  Patient with rectal prolapse visualized by previous ED provider.  Patient was unable to  tolerate bedside reduction due to pain.  Analgesics were offered in sugar and ice was applied with some improvements.  Repeat attempt at gentle manual reduction was not tolerated by patient to then refused any further attempts at manual reduction. General surgery consult pending at time of shift change.  General surgery consult call received back at 7:30 AM from APP who recommends repeat application of sugar and subsequent attempt at manual reduction.  He plans to see the patient in the emergency department later this morning.  I appreciate his collaboration in the care of this patient.  Patient was evaluated with persistent rectal prolapse on physical exam, he states his discomfort is improved from prior and that he feels the swelling has gone down.  Additional sugar was applied to the area.  Discussed role of repeat attempt for manual reduction with the patient who adamantly refuses.  He states that his experience this past Saturday was "traumatic, the doctor was way too aggressive" and states that there is "no chance anyone tries that again today".  He was informed that the surgical APP will be presenting to his room at some point this morning for further evaluation.  He voiced understanding. Resting  comfortably at this time.   11:20 Patient began screaming in the hallway, stating he wants to leave.  I reiterated this patient and the importance of being waiting to be evaluated by the surgical team as well as the fact that the prolapse will likely not reduce itself and will need to be manually reduced by either the emergency department or surgical team.  He adamantly refuses any attempt at manual reduction and no longer is willing to stay for surgical evaluation. He is refusing to stay in the emergency department and demands that he will be leaving the department AGAINST MEDICAL ADVICE. He did allow me to reevaluate the prolapse prior to his discharge; no improvement in prolapse despite second application of  sugar.  Again explained the importance of reduction of this patient's rectal prolapse, however he demands to leave the emergency department at this time.  He is refusing any further medical help. His vital signs remain stable, he has left the department AGAINST MEDICAL ADVICE at this time.  Addendum: I was informed by RN that surgical PA presented to the ED prior to the patient's departure, and the patient agreed to examination. Surgical APP Leary Roca, PA-C informed this provider that patient has prolapse of internal hemorrhoids rather than the rectum itself; patient allowed him to reduce them manually at the bedside. He recommends d/c with colace and miralax and followup with surgery outpatient. I appreciate his collaboration in the care of this patient. D/c paperwork updated, colace prescribed.   This chart was dictated using voice recognition software, Dragon. Despite the best efforts of this provider to proofread and correct errors, errors may still occur which can change documentation meaning.   Wesley Lore, PA-C 09/29/20 1130    Wesley Stewart, Wesley Gavia, PA-C 09/29/20 1156    Gwyneth Sprout, MD 10/02/20 2330

## 2020-09-29 NOTE — ED Provider Notes (Cosign Needed)
[facility]-EMERGENCY DEPT Provider Note   CSN: [id number] Arrival date & time: 09/29/20  0356     History Chief Complaint  Patient presents with  . Rectal Pain    Wesley Stewart is a 40 y.o. male with PMH/o alcohol abuse, alcoholic gastritis who presents for evaluation of rectal pain.  Patient has a longstanding history of rectal prolapse.  He was seen here on 09/27/20 for evaluation of same symptoms.  At that time, was resolved.  He states that since then, it has been sore but earlier this evening, he states that he went to go the bathroom and states that he felt like it popped back out.  He states he has not seen surgery.  He denies any abdominal pain, difficulty urinating.  He has not had any vomiting but has had some nausea.  No fevers.  The history is provided by the patient.       Past Medical History:  Diagnosis Date  . Alcohol abuse   . Alcoholic gastritis     Patient Active Problem List   Diagnosis Date Noted  . 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 Alcohol abuse 03/02/2012  . Weakness of wrist 02/29/2012    Past Surgical History:  Procedure Laterality Date  . FOREARM SURGERY         Family History  Problem Relation Age of Onset  . Stroke Other     Social History   Tobacco Use  . Smoking status: Current Every Day Smoker    Packs/day: 0.50    Years: 15.00    Pack years: 7.50    Types: Cigarettes  . Smokeless tobacco: Never Used  Substance Use Topics  . Alcohol use: Yes    Comment: occasion  . Drug use: Yes    Frequency: 3.0 times per week    Types: Marijuana    Home Medications Prior to Admission medications   Medication Sig Start Date End Date Taking? Authorizing Provider  docusate sodium (COLACE) 100 MG capsule Take 1 capsule (100 mg total) by mouth every 12 (twelve) hours. 04/15/20   Horton, Mayer Masker, MD  lidocaine (LMX) 4 % cream Apply 1 application topically as needed. 04/15/20   Horton,  Mayer Masker, MD  lidocaine (LMX) 4 % cream Apply 1 application topically as needed. Apply pea-size amount to suppository before inserting in anus 05/04/20   Trifan, Kermit Balo, MD  polyethylene glycol (MIRALAX) 17 g packet Take 17 g by mouth daily. 04/15/20   Horton, Mayer Masker, MD  pantoprazole (PROTONIX) 40 MG tablet Take 1 tablet (40 mg total) by mouth 2 (two) times daily before a meal. Patient not taking: Reported on 09/28/2015 01/27/15 05/05/20  Melene Plan, DO    Allergies    Patient has no known allergies.  Review of Systems   Review of Systems  Constitutional: Negative for fever.  Gastrointestinal: Positive for rectal pain. Negative for abdominal pain, nausea and vomiting.  Genitourinary: Negative for difficulty urinating.  All other systems reviewed and are negative.   Physical Exam Updated Vital Signs BP (!) 156/97   Pulse 80   Temp 97.7 F (36.5 C)   Resp 19   SpO2 100%   Physical Exam Vitals and nursing note reviewed. Exam conducted with a chaperone present.  Constitutional:      Appearance: He is well-developed.  HENT:     Head: Normocephalic and atraumatic.  Eyes:     General: No scleral icterus.  Right eye: No discharge.        Left eye: No discharge.     Conjunctiva/sclera: Conjunctivae normal.  Pulmonary:     Effort: Pulmonary effort is normal.  Abdominal:     Tenderness: There is generalized abdominal tenderness.     Comments: Abdomen is soft, non-distended, non-tender. No rigidity, No guarding. No peritoneal signs.  Genitourinary:    Comments: The exam was performed with a chaperone present Eyvonne Mechanic, RN).  Erythematous mucosa noted.  Rectal prolapse noted. Skin:    General: Skin is warm and dry.  Neurological:     Mental Status: He is alert.  Psychiatric:        Speech: Speech normal.        Behavior: Behavior normal.     ED Results / Procedures / Treatments   Labs (all labs ordered are listed, but only abnormal results are displayed) Labs  Reviewed - No data to display  EKG None  Radiology No results found.  Procedures Procedures   Medications Ordered in ED Medications  ondansetron (ZOFRAN) injection 4 mg (4 mg Intravenous Given 09/29/20 0613)  fentaNYL (SUBLIMAZE) injection 50 mcg (50 mcg Intravenous Given 09/29/20 0613)  ketorolac (TORADOL) 30 MG/ML injection 30 mg (30 mg Intravenous Given 09/29/20 0613)  fentaNYL (SUBLIMAZE) injection 50 mcg (50 mcg Intravenous Given 09/29/20 4098)    ED Course  I have reviewed the triage vital signs and the nursing notes.  Pertinent labs & imaging results that were available during my care of the patient were reviewed by me and considered in my medical decision making (see chart for details).    MDM Rules/Calculators/A&P                          40 year old male who presents for evaluation of rectal pain that has been ongoing.  History of rectal prolapse.  Was seen here on 09/27/20 with same.  Had it resolved at that time but states he went to use the bathroom earlier today and it started again.  Has had pain since then.  He reports some nausea.  No vomiting, fevers.  He has not followed up with surgery.  On initial arrival, he is afebrile appears uncomfortable but no acute distress.  Vital signs are stable.  On exam, he does have some erythematous mucosal lining with rectal prolapse noted.  Attempted to reduce it at bedside but patient unable to tolerate pain.  Will give analgesics, sugar, and ice. Will plan to reassess.  Reevaluation.  There is some improvement in the rectal prolapse.  He is still having some pain it is resistant to me trying to reduce it here in the ED. Will give additional analgesics and reasses.   Re-evaluation. Prolapse has partially reduced but still prolapsed. I attempted gentle reduction manually but patient could not tolerate because of pain and refuses further attempts.   Will consult to general surgery for further recommendation. Patient signed out to  oncoming provider, Haze Rushing, PA.   Portions of this note were generated with Scientist, clinical (histocompatibility and immunogenetics). Dictation errors may occur despite best attempts at proofreading.  Final Clinical Impression(s) / ED Diagnoses Final diagnoses:  Rectal prolapse    Rx / DC Orders ED Discharge Orders    None       Maxwell Caul, PA-C 09/29/20 0725

## 2020-09-29 NOTE — ED Notes (Signed)
Surgical PA to bedside at this time.

## 2020-09-29 NOTE — ED Notes (Signed)
Patient given hand urinal per request, side rail lowered, sugar placed at bedside

## 2020-09-29 NOTE — ED Notes (Signed)
Wesley Stewart, [name] again attempted to reduce prolapse. PT c/o significant pain, asking for sedation despite explanation that it could not be given and pain meds had been provided x2.

## 2020-09-29 NOTE — ED Triage Notes (Signed)
Patient arrives after being evaluated for same the other day. Patient states he used the restroom when he woke up this morning and had a sudden onset of severe pain.

## 2020-09-29 NOTE — Discharge Instructions (Addendum)

## 2020-09-29 NOTE — ED Notes (Addendum)
Per [place], [name], poured 2 sugar packs on prolapse. Placed chux and ice pack over top. Lynnell Chad, NT in room as chaperone

## 2020-09-29 NOTE — ED Notes (Signed)
Prolapse reduce 75%. Mardella Layman, PA attempted to reduce the rest but pt stopped procedure due to pain. Poured 2 additional sugar packets and replaced chux/ice bag.

## 2020-09-29 NOTE — ED Notes (Signed)
Sugar applied to rectum at this time by [facility] PA

## 2020-09-29 NOTE — ED Notes (Signed)
Patient still laying prone, will continue to monitor

## 2020-09-29 NOTE — Consult Note (Signed)
Wesley Stewart 1981-01-15  [id number].    Requesting MD: Gwyneth Sprout Chief Complaint/Reason for Consult: rectal prolapse  HPI:  Wesley Stewart is a 40yo male PMH alcohol abuse who presented to [facility] earlier today complaining of rectal pain.   Patient reports this issue began approximately 2-3 years ago.  He first noticed this when he felt protruding tissue from his bottom and blood on tissue paper when wiping after using the restroom.  He reports for the last 7-8 months it is worsened.  He reports most of the time when he sits down on the commode to have a BM he will feel tissue protrude from his anus.  Normally after using the restroom he can sit down on a chair, relax and will ease back in.  On a few occasions he has had difficulty with it self reducing which prompted several ED visit.  He most recently was seen in the ED 2 days prior for the same issue where it was able to be reduced.  He states last night when he went to have a BM, he strained and felt the tissue prolapse. It would not reduce afterwards. Attempts at reduction have been unsuccessful in the ED despite sugar etc.  We were asked to see for prolapsed rectum.  Patient's main complaint is pain to his rectum presently.  He denies any fever, chills, nausea, vomiting, abdominal pain.  He reports he normally has 3 small, hard stools per day.  He does have to strain when using the restroom.  He is not taking any medications for bowel regimen.  Per the patient he has been referred to our office in the past for this but missed the appointments.  He currently works in a [employer] heavy objects multiple times per day.  He reports his symptoms do not occur when lifting heavy objects but only when he sits down to have a bowel movement.  No prior h/o abdominal surgery Never had a colonoscopy Smokes 1/2 PPD Admits to [facility] use, otherwise denies illicit drug use Drinks 4 beers every other day  Review of Systems  Constitutional:  Negative for chills and fever.  Respiratory: Negative for cough and shortness of breath.   Cardiovascular: Negative for chest pain.  Gastrointestinal: Negative for abdominal pain, diarrhea, nausea and vomiting.  Genitourinary: Negative for dysuria.  Musculoskeletal: Negative for back pain.  Psychiatric/Behavioral: Positive for substance abuse.  All other systems reviewed and are negative.   Family History  Problem Relation Age of Onset  . Stroke Other     Past Medical History:  Diagnosis Date  . Alcohol abuse   . Alcoholic gastritis     Past Surgical History:  Procedure Laterality Date  . FOREARM SURGERY      Social History:  reports that he has been smoking cigarettes. He has a 7.50 pack-year smoking history. He has never used smokeless tobacco. He reports current alcohol use. He reports current drug use. Frequency: 3.00 times per week. Drug: Marijuana.  Allergies: No Known Allergies  (Not in a hospital admission)   Physical Exam: Blood pressure (!) 162/93, pulse (!) 59, temperature (!) 97.4 F (36.3 C), temperature source Oral, resp. rate 16, SpO2 100 %. General: WD/WN male who is laying in bed in NAD HEENT: head is normocephalic, atraumatic.  Sclera are noninjected.  PERRL.  Ears and nose without any masses or lesions.  Mouth is pink and moist. Dentition fair Heart: regular, rate, and rhythm.  Normal s1,s2. No obvious  murmurs, gallops, or rubs noted.  Palpable pedal pulses bilaterally  Lungs: CTAB, no wheezes, rhonchi, or rales noted.  Respiratory effort nonlabored Abd: Soft, NT/ND, +BS, no masses, hernias, or organomegaly MS: no BUE/BLE edema, calves soft and nontender Skin: warm and dry with no masses, lesions, or rashes Psych: A&Ox4 with an appropriate affect Neuro: cranial nerves grossly intact, equal strength in BUE/BLE bilaterally, normal speech, thought process intact GU: Chaperone present, NT - Martin. Prolapse of nonbleeding internal hemorrhoids as seen in  photo below. With patients permission I was able to reduce this manually. No rectal prolapse. Photo's were taken with pateints consent. ,        No results found for this or any previous visit (from the past 48 hour(s)). No results found.  Anti-infectives (From admission, onward)   None     Assessment/Plan Prolapsed internal hemorrhoids - now reduced - This is a 40 year old male who presented with prolapsed internal hemorrhoids. There was no evidence of necrosis. He was initially very frustrated when I saw him, wanting to leave, but after explaining the pathophysiology, treatment options etc. he was agreeable to allow me to reduce these at bedside.  His pain subsided.  Patient is okay for discharge from our standpoint.  I have arranged follow-up with one of our colorectal surgeons.  I have recommended a bowel regimen for him and discussed the importance of not straining.  Patient was very thankful.   Jacinto Halim, [facility] Surgery 09/29/2020, 11:50 AM Please see Amion for pager number during day hours 7:00am-4:30pm

## 2020-10-14 ENCOUNTER — Encounter: Payer: Self-pay | Admitting: Surgery

## 2020-10-14 ENCOUNTER — Ambulatory Visit: Payer: Self-pay | Admitting: Surgery

## 2020-10-14 DIAGNOSIS — K642 Third degree hemorrhoids: Secondary | ICD-10-CM | POA: Insufficient documentation

## 2020-10-14 NOTE — H&P (Signed)
[place] Faster Appointment: 10/14/2020 10:00 AM Location: [place] [facility] Patient #: [id number] DOB: 11/06/1980 Single / Language: [place] / Race: Black or African American Male   History of Present Illness Wesley Sportsman MD; 10/14/2020 12:06 PM) The patient is a 40 year old male who presents with hemorrhoids. Note for "Hemorrhoids": ` ` ` Patient sent for surgical consultation at the request of MMaczis  Chief Complaint: Prolapsing rectal tissue. Probable hemorrhoids. ` ` The patient is a Young male that is noticed tissue popping out with bowel movements. Worsening pain discomfort and irritation. Frustrating. Had a flare that was very bad last month. [facility]. Evaluation suspicious for prolapsing hemorrhoids. Rather inflamed but not thrombosed. Recommendation made for soaks in bowel regimen to allow the current flare to calm down with outpatient colorectal surgical follow-up. 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 I don't think he is ever had any outpatient surgery follow-up. I think uses the emergency room for his medical care and does not have a regular primary care physician. He claims he has been meaning to get one switched over but hasn't done that yet. He denies any family history of bowel problems. He does tend to strain and have hard stools. Since his last year visit where he had a lot of pain and bleeding and swelling, he is been taking a laxative that he thinks is MiraLAX twice a day. His stools are softer. His hemorrhoids are not as bothersome but they still pop out with every bowel movement and cause discomfort.  He smokes less than a half a pack a day. Can walk a couple hours without difficulty. No diabetes or sleep apnea. On any blood thinners. Usually moves his bowels once or twice a day on the MiraLAX now. No personal nor family history  of GI/colon cancer, inflammatory bowel disease, irritable bowel syndrome, allergy such as Celiac Sprue, dietary/dairy problems, colitis, ulcers nor gastritis. No recent sick contacts/gastroenteritis. No travel outside the country. No changes in diet. No dysphagia to solids or liquids. No significant heartburn or reflux. No melena, hematemesis, coffee ground emesis. No evidence of prior gastric/peptic ulceration.  (Review of systems as stated in this history (HPI) or in the review of systems. Otherwise all other 12 point ROS are negative) ` ` ###########################################`  This patient encounter took 25 minutes today to perform the following: obtain history, perform exam, review outside records, interpret tests & imaging, counsel the patient on their diagnosis; and, document this encounter, including findings & plan in the electronic health record (EHR).   Past Surgical History Ethlyn Gallery, CMA; 10/14/2020 10:05 AM) No pertinent past surgical history   Diagnostic Studies History Ethlyn Gallery, CMA; 10/14/2020 10:05 AM) Colonoscopy  never  Allergies (Alisha Spillers, CMA; 10/14/2020 10:06 AM) No Known Drug Allergies  [10/14/2020]:  Medication History Ethlyn Gallery, CMA; 10/14/2020 10:06 AM) No Current Medications Medications Reconciled  Social History Ethlyn Gallery, CMA; 10/14/2020 10:05 AM) Alcohol use  Moderate alcohol use. Caffeine use  Tea. Illicit drug use  Prefer to discuss with provider. Tobacco use  Current every day smoker.  Family History Ethlyn Gallery, CMA; 10/14/2020 10:05 AM) Cervical Cancer  Mother.  Other Problems Ethlyn Gallery, CMA; 10/14/2020 10:05 AM) Alcohol Abuse  Hemorrhoids     Review of Systems (Alisha Spillers CMA; 10/14/2020 10:05 AM) General Not Present- Appetite Loss, Chills, Fatigue, Fever, Night Sweats, Weight Gain  and Weight Loss. Skin Not Present- Change in Wart/Mole, Dryness, Hives, Jaundice, New  Lesions, Non-Healing Wounds, Rash and Ulcer. HEENT Not Present- Earache, Hearing Loss, Hoarseness, Nose Bleed, Oral Ulcers, Ringing in the Ears, Seasonal Allergies, Sinus Pain, Sore Throat, Visual Disturbances, Wears glasses/contact lenses and Yellow Eyes. Respiratory Not Present- Bloody sputum, Chronic Cough, Difficulty Breathing, Snoring and Wheezing. Breast Not Present- Breast Mass, Breast Pain, Nipple Discharge and Skin Changes. Cardiovascular Not Present- Chest Pain, Difficulty Breathing Lying Down, Leg Cramps, Palpitations, Rapid Heart Rate, Shortness of Breath and Swelling of Extremities. Gastrointestinal Present- Abdominal Pain, Bloody Stool, Hemorrhoids and Rectal Pain. Not Present- Bloating, Change in Bowel Habits, Chronic diarrhea, Constipation, Difficulty Swallowing, Excessive gas, Gets full quickly at meals, Indigestion, Nausea and Vomiting. Male Genitourinary Not Present- Blood in Urine, Change in Urinary Stream, Frequency, Impotence, Nocturia, Painful Urination, Urgency and Urine Leakage.  Vitals (Alisha Spillers CMA; 10/14/2020 10:06 AM) 10/14/2020 10:05 AM Weight: 135 lb Height: 69in Body Surface Area: 1.75 m Body Mass Index: 19.94 kg/m  Pulse: 73 (Regular)  BP: 130/80(Sitting, Left Arm, Standard)       Physical Exam Wesley Sportsman MD; 10/14/2020 10:21 AM) General Mental Status-Alert. General Appearance-Not in acute distress, Not Sickly. Orientation-Oriented X3. Hydration-Well hydrated. Voice-Normal.  Integumentary Global Assessment Upon inspection and palpation of skin surfaces of the - Axillae: non-tender, no inflammation or ulceration, no drainage. and Distribution of scalp and body hair is normal. General Characteristics Temperature - normal warmth is noted.  Head and Neck Head-normocephalic, atraumatic with no lesions or palpable masses. Face Global Assessment - atraumatic, no absence of expression. Neck Global Assessment - no  abnormal movements, no bruit auscultated on the right, no bruit auscultated on the left, no decreased range of motion, non-tender. Trachea-midline. Thyroid Gland Characteristics - non-tender.  Eye Eyeball - Left-Extraocular movements intact, No Nystagmus - Left. Eyeball - Right-Extraocular movements intact, No Nystagmus - Right. Cornea - Left-No Hazy - Left. Cornea - Right-No Hazy - Right. Sclera/Conjunctiva - Left-No scleral icterus, No Discharge - Left. Sclera/Conjunctiva - Right-No scleral icterus, No Discharge - Right. Pupil - Left-Direct reaction to light normal. Pupil - Right-Direct reaction to light normal.  ENMT Ears Pinna - Left - no drainage observed, no generalized tenderness observed. Pinna - Right - no drainage observed, no generalized tenderness observed. Nose and Sinuses External Inspection of the Nose - no destructive lesion observed. Inspection of the nares - Left - quiet respiration. Inspection of the nares - Right - quiet respiration. Mouth and Throat Lips - Upper Lip - no fissures observed, no pallor noted. Lower Lip - no fissures observed, no pallor noted. Nasopharynx - no discharge present. Oral Cavity/Oropharynx - Tongue - no dryness observed. Oral Mucosa - no cyanosis observed. Hypopharynx - no evidence of airway distress observed.  Chest and Lung Exam Inspection Movements - Normal and Symmetrical. Accessory muscles - No use of accessory muscles in breathing. Palpation Palpation of the chest reveals - Non-tender. Auscultation Breath sounds - Normal and Clear.  Cardiovascular Auscultation Rhythm - Regular. Murmurs & Other Heart Sounds - Auscultation of the heart reveals - No Murmurs and No Systolic Clicks.  Abdomen Inspection Inspection of the abdomen reveals - No Visible peristalsis and No Abnormal pulsations. Umbilicus - No Bleeding, No Urine drainage. Palpation/Percussion Palpation and Percussion of the abdomen reveal - Soft, Non  Tender, No Rebound tenderness, No Rigidity (guarding) and No Cutaneous hyperesthesia. Note: Abdomen soft. Nontender. Not distended. No umbilical or incisional hernias. No guarding.   Male Genitourinary Sexual  Maturity Tanner 5 - Adult hair pattern and Adult penile size and shape. Note: No inguinal hernias. Normal external genitalia. Epididymi, testes, and spermatic cords normal without any masses.   Rectal Note: #######################  please refer to anoscopy  Perianal skin clean with good hygiene. No pruritis ani. No pilonidal disease. No fissure. No abscess/fistula. some redundant anal tissue but no classic external hemorrhoid. Slightly increased sphincter tone with spasming and thickening but eventually relaxes enough to tolerate digital and some anoscopic exam.  Grade 3 internal hemorrhoids right posterior largest. Comes out easily. Sensitive but can reduce digitally. Left side spontaneously reduces. Some irritation and friability but no major bleeding. No obvious tumor or abscess felt. No fissures noted. No condyloma warts.   Peripheral Vascular Upper Extremity Inspection - Left - No Cyanotic nailbeds - Left, Not Ischemic. Inspection - Right - No Cyanotic nailbeds - Right, Not Ischemic.  Neurologic Neurologic evaluation reveals -normal attention span and ability to concentrate, able to name objects and repeat phrases. Appropriate fund of knowledge , normal sensation and normal coordination. Mental Status Affect - not angry, not paranoid. Cranial Nerves-Normal Bilaterally. Gait-Normal.  Neuropsychiatric Mental status exam performed with findings of-able to articulate well with normal speech/language, rate, volume and coherence, thought content normal with ability to perform basic computations and apply abstract reasoning and no evidence of hallucinations, delusions, obsessions or homicidal/suicidal ideation.  Musculoskeletal Global Assessment Spine, Ribs and  Pelvis - no instability, subluxation or laxity. Right Upper Extremity - no instability, subluxation or laxity.  Lymphatic Head & Neck  General Head & Neck Lymphatics: Bilateral - Description - No Localized lymphadenopathy. Axillary  General Axillary Region: Bilateral - Description - No Localized lymphadenopathy. Femoral & Inguinal  Generalized Femoral & Inguinal Lymphatics: Left - Description - No Localized lymphadenopathy. Right - Description - No Localized lymphadenopathy.    Assessment & Plan Wesley Stewart(Trystin Hargrove C. Nikaela Coyne MD; 10/14/2020 10:29 AM) PROLAPSED INTERNAL HEMORRHOIDS, GRADE 3 (K64.2) Impression: worsening hemorrhoids with prolapse and bleeding and severe pain. A little more tolerable now that he is compliant on a MiraLAX bowel regimen but still significant issues.  I think this requires surgery to get his hemorrhoidal anatomy much more normal. Outpatient hemorrhoid ligation Paxene probable hemorrhoidectomies of prolapsing tissue.  I think his operative risk is rather low that I did caution there is significant pain especially the first few weeks. That gradually improved.  I encouraged him to quit smoking.  He is interested in proceeding a break in cycle of hemorrhoidal pain and flares and ER visits. We will work to coordinate a convenient time Current Plans You are being scheduled for surgery- Our schedulers will call you.  You should hear from our office's scheduling department within 5 working days about the location, date, and time of surgery. We try to make accommodations for patient's preferences in scheduling surgery, but sometimes the OR schedule or the surgeon's schedule prevents us from making those accommodations.  If you have not heard from our office [phone]) in 5 working days, call the office and ask for your surgeon's nurse.  If you have other questions about your diagnosis, plan, or surgery, call the office and ask for your surgeon's nurse.  The anatomy &  physiology of the anorectal region was discussed. The pathophysiology of hemorrhoids and differential diagnosis was discussed. Natural history risks without surgery was discussed. I stressed the importance of a bowel regimen to have daily soft bowel movements to minimize progression of disease. Interventions such as sclerotherapy & banding were discussed.  The patient's symptoms are not adequately controlled by medicines and other non-operative treatments. I feel the risks & problems of no surgery outweigh the operative risks; therefore, I recommended surgery to treat the hemorrhoids by ligation, pexy, and possible resection.  Risks such as bleeding, infection, urinary difficulties, need for further treatment, heart attack, death, and other risks were discussed. I noted a good likelihood this will help address the problem. Goals of post-operative recovery were discussed as well. Possibility that this will not correct all symptoms was explained. Post-operative pain, bleeding, constipation, and other problems after surgery were discussed. We will work to minimize complications. Educational handouts further explaining the pathology, treatment options, and bowel regimen were given as well. Questions were answered. The patient expresses understanding & wishes to proceed with surgery.  PROLAPSED INTERNAL HEMORRHOIDS, GRADE 2 (K64.1) Impression: The anatomy & physiology of the anorectal region was discussed. The pathophysiology of hemorrhoids and differential diagnosis was discussed. Natural history progression was discussed. I stressed the importance of a bowel regimen to have daily soft bowel movements to minimize progression of disease. Goal of one BM / day ideal. Use of wet wipes, warm baths, avoiding straining, etc were emphasized.  Educational handouts further explaining the pathology, treatment options, and bowel regimen were given as well. The patient expressed understanding. Current  Plans ANOSCOPY, DIAGNOSTIC (35597) Pt Education - [facility] Hemorrhoids (Genevive Printup): discussed with patient and provided information. Pt Education - Pamphlet Given - The Hemorrhoid Book: discussed with patient and provided information. ENCOUNTER FOR PREOPERATIVE EXAMINATION FOR GENERAL SURGICAL PROCEDURE (Z01.818) Current Plans You are being scheduled for surgery- Our schedulers will call you.  You should hear from our office's scheduling department within 5 working days about the location, date, and time of surgery. We try to make accommodations for patient's preferences in scheduling surgery, but sometimes the OR schedule or the surgeon's schedule prevents [place] from making those accommodations.  If you have not heard from our office [phone]) in 5 working days, call the office and ask for your surgeon's nurse.  If you have other questions about your diagnosis, plan, or surgery, call the office and ask for your surgeon's nurse.  Pt Education - [facility] Rectal Prep for Anorectal outpatient/office surgery: discussed with patient and provided information. Pt Education - [facility] Rectal Surgery HCI (Maebell Lyvers): discussed with patient and provided information. Pt Education - [facility] Good Bowel Health (Milda Lindvall) TOBACCO ABUSE (Z72.0) Impression: STOP SMOKING!  We talked to the patient about the dangers of smoking. We stressed that tobacco use dramatically increases the risk of peri-operative complications such as infection, tissue necrosis leaving to problems with incision/wound and organ healing, hernia, chronic pain, heart attack, stroke, DVT, pulmonary embolism, and death. We noted there are programs in our community to help stop smoking. Information was available. Current Plans Pt Education - [facility] STOP SMOKING! CHRONIC CONSTIPATION (K59.09) Current Plans Pt Education - [facility] Constipation (AT) Pt Education - [facility] Good Bowel Health (Dagen Beevers)  Wesley Sportsman, MD, FACS, MASCRS  Esophageal, Gastrointestinal & Colorectal  Surgery Robotic and Minimally Invasive Surgery [facility] [address] [place], [place] [address] [phone] Fax [phone] Main/Paging  CONTACT INFORMATION: Weekday (9AM-5PM) concerns: Call [facility] main office at [phone] Weeknight (5PM-9AM) or Weekend/Holiday concerns: Check [email or web address] for General Surgery [facility] coverage (Please, do not use SecureChat as it is not reliable communication to operating surgeons for immediate patient care)

## 2021-07-11 ENCOUNTER — Other Ambulatory Visit: Payer: Self-pay

## 2021-07-11 ENCOUNTER — Emergency Department (HOSPITAL_COMMUNITY)
Admission: EM | Admit: 2021-07-11 | Discharge: 2021-07-11 | Disposition: A | Discharge status: Home / Self Care | Payer: BC Managed Care – PPO | Attending: Emergency Medicine | Admitting: Emergency Medicine

## 2021-07-11 DIAGNOSIS — K0889 Other specified disorders of teeth and supporting structures: Secondary | ICD-10-CM | POA: Diagnosis present

## 2021-07-11 MED ORDER — AMOXICILLIN-POT CLAVULANATE 875-125 MG PO TABS
1.0000 | ORAL_TABLET | Freq: Once | ORAL | Status: AC
  Administered 2021-07-11: 1 via ORAL
  Filled 2021-07-11: qty 1

## 2021-07-11 MED ORDER — OXYCODONE-ACETAMINOPHEN 5-325 MG PO TABS
1.0000 | ORAL_TABLET | Freq: Once | ORAL | Status: AC
  Administered 2021-07-11: 1 via ORAL
  Filled 2021-07-11: qty 1

## 2021-07-11 MED ORDER — AMOXICILLIN-POT CLAVULANATE 875-125 MG PO TABS: 1.0000 | ORAL_TABLET | Freq: Two times a day (BID) | ORAL | 0 refills | Status: AC

## 2021-07-11 NOTE — ED Provider Notes (Signed)
[facility] DEPT Provider Note   CSN: [id number] Arrival date & time: 07/11/21  [id number]     History Chief Complaint  Patient presents with   Facial Swelling    Wesley Stewart is a 41 y.o. male who presents to the emergency department with left-sided facial swelling and dental pain over the last couple of days.  Patient is complaining of dental pain localized to the left upper gumline with associated facial swelling.  He rates his pain 8/10 in severity.  He is able to swallow and breathe normally.  No shortness of breath.  No sore throat.  No eye pain. No fever or chills.   HPI     Home Medications Prior to Admission medications   Medication Sig Start Date End Date Taking? Authorizing Provider  amoxicillin-clavulanate (AUGMENTIN) 875-125 MG tablet Take 1 tablet by mouth every 12 (twelve) hours. 07/11/21  Yes Raul Del, Tavari Loadholt M, PA-C  docusate sodium (COLACE) 100 MG capsule Take 1 capsule (100 mg total) by mouth 2 (two) times daily. 09/29/20 09/29/21  Maczis, Barth Kirks, PA-C  lidocaine (LMX) 4 % cream Apply 1 application topically as needed. 04/15/20   Horton, Barbette Hair, MD  lidocaine (LMX) 4 % cream Apply 1 application topically as needed. Apply pea-size amount to suppository before inserting in anus 05/04/20   Trifan, Carola Rhine, MD  polyethylene glycol (MIRALAX) 17 g packet Take 17 g by mouth 2 (two) times daily. 09/29/20   Maczis, Barth Kirks, PA-C  pantoprazole (PROTONIX) 40 MG tablet Take 1 tablet (40 mg total) by mouth 2 (two) times daily before a meal. Patient not taking: Reported on 09/28/2015 01/27/15 05/05/20  Deno Etienne, DO      Allergies    Patient has no known allergies.    Review of Systems   Review of Systems  All other systems reviewed and are negative.  Physical Exam Updated Vital Signs BP (!) 158/108 (BP Location: Right Arm)    Pulse (!) 104    Temp 98.8 F (37.1 C) (Oral)    Resp 18    Ht 5\' 9"  (1.753 m)    Wt 63 kg    SpO2 100%    BMI 20.53  kg/m  Physical Exam Vitals and nursing note reviewed.  Constitutional:      Appearance: Normal appearance.  HENT:     Head: Normocephalic and atraumatic.     Comments: Mild facial swelling primarily of the maxillary region.    Mouth/Throat:   Eyes:     General:        Right eye: No discharge.        Left eye: No discharge.     Conjunctiva/sclera: Conjunctivae normal.  Pulmonary:     Effort: Pulmonary effort is normal.  Skin:    General: Skin is warm and dry.     Findings: No rash.  Neurological:     General: No focal deficit present.     Mental Status: He is alert.  Psychiatric:        Mood and Affect: Mood normal.        Behavior: Behavior normal.    ED Results / Procedures / Treatments   Labs (all labs ordered are listed, but only abnormal results are displayed) Labs Reviewed - No data to display  EKG None  Radiology No results found.  Procedures Procedures   Medications Ordered in ED Medications  oxyCODONE-acetaminophen (PERCOCET/ROXICET) 5-325 MG per tablet 1 tablet (has no administration in time range)  amoxicillin-clavulanate (AUGMENTIN) 875-125 MG per tablet 1 tablet (has no administration in time range)    ED Course/ Medical Decision Making/ A&P                           Medical Decision Making Risk Prescription drug management.   Wesley Stewart is a 41 y.o. male who presents to the emergency department for evaluation of dental pain.  I have a low suspicion for deep-seated infection at this time.  Patient was briefly tachycardic on arrival I think this is likely secondary to pain.  He is afebrile.  There is no evidence of respiratory distress and he was able to talk in complete sentences.  I did not see any specific abscess on my exam.  I will plan to give him a Percocet and start him on Augmentin given his first dose here in the department.  Patient does have a dentist but they were closed yesterday.  He plans to call them on Monday.  It is an  appropriate plan.  I do believe he would benefit from outpatient follow-up.  I instructed him to take Tylenol ibuprofen as needed for pain.  He was up amenable this plan.  Strict turn precautions given.  He is safer discharge.    Final Clinical Impression(s) / ED Diagnoses Final diagnoses:  Pain, dental    Rx / DC Orders ED Discharge Orders          Ordered    amoxicillin-clavulanate (AUGMENTIN) 875-125 MG tablet  Every 12 hours        07/11/21 1055              Myna Bright [place], [place] 07/11/21 1056    Valarie Merino, MD 07/11/21 1416

## 2021-07-11 NOTE — ED Triage Notes (Signed)
Patient reports toothache left top started Thursday night. Patient now has swelling to left face. Rates pain 8/10.

## 2021-07-11 NOTE — Discharge Instructions (Signed)
Please take antibiotics as prescribed.  Please call your dentist on Monday to set up an evaluation.  Please return to the emergency department if you begin having fevers, swelling gets worse, unable to talk normally, have trouble breathing, or any other concern you may have.  Please take ibuprofen or Tylenol with a scheduled basis for pain control.

## 2023-04-13 ENCOUNTER — Emergency Department (HOSPITAL_COMMUNITY): Payer: Medicaid Other

## 2023-04-13 ENCOUNTER — Emergency Department (HOSPITAL_COMMUNITY): Payer: BC Managed Care – PPO

## 2023-04-13 ENCOUNTER — Observation Stay (HOSPITAL_COMMUNITY)
Admission: EM | Admit: 2023-04-13 | Discharge: 2023-04-15 | Discharge status: Against Medical Advice | Payer: BC Managed Care – PPO | Attending: Family Medicine | Admitting: Family Medicine

## 2023-04-13 ENCOUNTER — Other Ambulatory Visit: Payer: Self-pay

## 2023-04-13 ENCOUNTER — Encounter (HOSPITAL_COMMUNITY): Payer: Self-pay

## 2023-04-13 DIAGNOSIS — J439 Emphysema, unspecified: Secondary | ICD-10-CM | POA: Diagnosis not present

## 2023-04-13 DIAGNOSIS — I160 Hypertensive urgency: Secondary | ICD-10-CM | POA: Diagnosis not present

## 2023-04-13 DIAGNOSIS — F17200 Nicotine dependence, unspecified, uncomplicated: Secondary | ICD-10-CM

## 2023-04-13 DIAGNOSIS — R079 Chest pain, unspecified: Principal | ICD-10-CM | POA: Diagnosis present

## 2023-04-13 DIAGNOSIS — R2991 Unspecified symptoms and signs involving the musculoskeletal system: Secondary | ICD-10-CM | POA: Insufficient documentation

## 2023-04-13 DIAGNOSIS — Z79899 Other long term (current) drug therapy: Secondary | ICD-10-CM | POA: Insufficient documentation

## 2023-04-13 DIAGNOSIS — R0789 Other chest pain: Secondary | ICD-10-CM | POA: Diagnosis not present

## 2023-04-13 LAB — BASIC METABOLIC PANEL
Anion gap: 8 (ref 5–15)
BUN: 21 mg/dL — ABNORMAL HIGH (ref 6–20)
CO2: 22 mmol/L (ref 22–32)
Calcium: 8.8 mg/dL — ABNORMAL LOW (ref 8.9–10.3)
Chloride: 105 mmol/L (ref 98–111)
Creatinine, Ser: 0.98 mg/dL (ref 0.61–1.24)
GFR, Estimated: >60 mL/min (ref >60)
Glucose, Bld: 86 mg/dL (ref 70–99)
Potassium: 4.3 mmol/L (ref 3.5–5.1)
Sodium: 135 mmol/L (ref 135–145)

## 2023-04-13 LAB — URINALYSIS, ROUTINE W REFLEX MICROSCOPIC
Bilirubin Urine: NEGATIVE
Glucose, UA: NEGATIVE mg/dL
Hgb urine dipstick: NEGATIVE
Ketones, ur: NEGATIVE mg/dL
Leukocytes,Ua: NEGATIVE
Nitrite: NEGATIVE
Protein, ur: NEGATIVE mg/dL
Specific Gravity, Urine: 1.04 — ABNORMAL HIGH (ref 1.005–1.030)
pH: 6 (ref 5.0–8.0)

## 2023-04-13 LAB — RAPID URINE DRUG SCREEN, HOSP PERFORMED
Amphetamines: NOT DETECTED
Barbiturates: NOT DETECTED
Benzodiazepines: NOT DETECTED
Cocaine: NOT DETECTED
Opiates: NOT DETECTED
Tetrahydrocannabinol: POSITIVE — AB

## 2023-04-13 LAB — LIPASE, BLOOD: Lipase: 39 U/L (ref 11–51)

## 2023-04-13 LAB — HEPATIC FUNCTION PANEL
ALT: 20 U/L (ref 0–44)
AST: 28 U/L (ref 15–41)
Albumin: 4 g/dL (ref 3.5–5.0)
Alkaline Phosphatase: 55 U/L (ref 38–126)
Bilirubin, Direct: 0.1 mg/dL (ref 0.0–0.2)
Indirect Bilirubin: 0.9 mg/dL (ref 0.3–0.9)
Total Bilirubin: 1 mg/dL (ref <1.2)
Total Protein: 6.9 g/dL (ref 6.5–8.1)

## 2023-04-13 LAB — CBC
HCT: 46.6 % (ref 39.0–52.0)
Hemoglobin: 16 g/dL (ref 13.0–17.0)
MCH: 33.8 pg (ref 26.0–34.0)
MCHC: 34.3 g/dL (ref 30.0–36.0)
MCV: 98.3 fL (ref 80.0–100.0)
Platelets: 217 10*3/uL (ref 150–400)
RBC: 4.74 MIL/uL (ref 4.22–5.81)
RDW: 12.6 % (ref 11.5–15.5)
WBC: 5.2 10*3/uL (ref 4.0–10.5)
nRBC: 0 % (ref 0.0–0.2)

## 2023-04-13 LAB — TROPONIN I (HIGH SENSITIVITY)
Troponin I (High Sensitivity): 7 ng/L (ref <18)
Troponin I (High Sensitivity): 7 ng/L (ref <18)

## 2023-04-13 MED ORDER — HYDRALAZINE HCL 20 MG/ML IJ SOLN
10.0000 mg | Freq: Once | INTRAMUSCULAR | Status: AC
  Administered 2023-04-13: 10 mg via INTRAVENOUS
  Filled 2023-04-13: qty 1

## 2023-04-13 MED ORDER — HYDROCODONE-ACETAMINOPHEN 5-325 MG PO TABS
1.0000 | ORAL_TABLET | Freq: Three times a day (TID) | ORAL | Status: DC | PRN
  Administered 2023-04-14: 1 via ORAL
  Filled 2023-04-13: qty 1

## 2023-04-13 MED ORDER — PANTOPRAZOLE SODIUM 40 MG PO TBEC
40.0000 mg | DELAYED_RELEASE_TABLET | Freq: Every day | ORAL | Status: DC
  Administered 2023-04-14 – 2023-04-15 (×2): 40 mg via ORAL
  Filled 2023-04-13 (×2): qty 1

## 2023-04-13 MED ORDER — ONDANSETRON HCL 4 MG PO TABS: 4.0000 mg | ORAL_TABLET | Freq: Four times a day (QID) | ORAL | Status: DC | PRN

## 2023-04-13 MED ORDER — MORPHINE SULFATE (PF) 4 MG/ML IV SOLN
4.0000 mg | Freq: Once | INTRAVENOUS | Status: AC
  Administered 2023-04-13: 4 mg via INTRAVENOUS
  Filled 2023-04-13: qty 1

## 2023-04-13 MED ORDER — ACETAMINOPHEN 650 MG RE SUPP: 650.0000 mg | Freq: Four times a day (QID) | RECTAL | Status: DC | PRN

## 2023-04-13 MED ORDER — AMLODIPINE BESYLATE 10 MG PO TABS
10.0000 mg | ORAL_TABLET | Freq: Every day | ORAL | Status: DC
  Administered 2023-04-13 – 2023-04-15 (×3): 10 mg via ORAL
  Filled 2023-04-13: qty 1
  Filled 2023-04-13: qty 2
  Filled 2023-04-13: qty 1

## 2023-04-13 MED ORDER — ONDANSETRON HCL 4 MG/2ML IJ SOLN: 4.0000 mg | Freq: Four times a day (QID) | INTRAMUSCULAR | Status: DC | PRN

## 2023-04-13 MED ORDER — IOHEXOL 350 MG/ML SOLN
100.0000 mL | Freq: Once | INTRAVENOUS | Status: AC | PRN
  Administered 2023-04-13: 100 mL via INTRAVENOUS

## 2023-04-13 MED ORDER — SENNOSIDES-DOCUSATE SODIUM 8.6-50 MG PO TABS: 1.0000 | ORAL_TABLET | Freq: Every evening | ORAL | Status: DC | PRN

## 2023-04-13 MED ORDER — ACETAMINOPHEN 325 MG PO TABS: 650.0000 mg | ORAL_TABLET | Freq: Four times a day (QID) | ORAL | Status: DC | PRN

## 2023-04-13 MED ORDER — LIDOCAINE 5 % EX PTCH
1.0000 | MEDICATED_PATCH | CUTANEOUS | Status: DC
  Administered 2023-04-13 – 2023-04-14 (×2): 1 via TRANSDERMAL
  Filled 2023-04-13 (×2): qty 1

## 2023-04-13 MED ORDER — LABETALOL HCL 5 MG/ML IV SOLN
10.0000 mg | Freq: Once | INTRAVENOUS | Status: AC
  Administered 2023-04-13: 10 mg via INTRAVENOUS
  Filled 2023-04-13: qty 4

## 2023-04-13 MED ORDER — IPRATROPIUM-ALBUTEROL 0.5-2.5 (3) MG/3ML IN SOLN: 3.0000 mL | RESPIRATORY_TRACT | Status: DC | PRN

## 2023-04-13 MED ORDER — ENOXAPARIN SODIUM 40 MG/0.4ML IJ SOSY
40.0000 mg | PREFILLED_SYRINGE | INTRAMUSCULAR | Status: DC
  Administered 2023-04-13 – 2023-04-14 (×2): 40 mg via SUBCUTANEOUS
  Filled 2023-04-13 (×2): qty 0.4

## 2023-04-13 MED ORDER — LABETALOL HCL 5 MG/ML IV SOLN: 10.0000 mg | INTRAVENOUS | Status: DC | PRN

## 2023-04-13 NOTE — ED Notes (Signed)
Patient transported to CT 

## 2023-04-13 NOTE — ED Triage Notes (Addendum)
C/o right sided chest pain radiating through to back and right shoulder and non-productive  cough x5 days that started when working.  Pain is worse with inhalation and coughing.  Denies sob

## 2023-04-13 NOTE — ED Provider Notes (Addendum)
[facility]GENC[facility] DEPARTMENT AT [facility] Provider Note   CSN: [id number] Arrival date & time: 04/13/23  1213     History Chief Complaint  Patient presents with   Chest Pain    Wesley Stewart is a 42 y.o. male reportedly otherwise healthy presents to the ER for evaluation of chest pain for the past 5 days. He reports that the pain is worse with inspiration and movement. He denies trauma to the area, but does heavy lifting at work. He reports that when he moves, he has the pain in his back. Denies any SOB. Denies any palpitations, nausea, vomiting, abdominal pain.  Reports he has a chronic cough from smoking. No worsening of this. Denies any fever, rhinorrhea, or nasal congestion. Denies any recent sick contacts.    Chest Pain Associated symptoms: back pain and cough (chronic)   Associated symptoms: no abdominal pain, no diaphoresis, no fever, no nausea, no palpitations, no shortness of breath and no vomiting        Home Medications Prior to Admission medications   Medication Sig Start Date End Date Taking? Authorizing Provider  amoxicillin-clavulanate (AUGMENTIN) 875-125 MG tablet Take 1 tablet by mouth every 12 (twelve) hours. 07/11/21   Honor Loh M, PA-C  lidocaine (LMX) 4 % cream Apply 1 application topically as needed. 04/15/20   Horton, Mayer Masker, MD  lidocaine (LMX) 4 % cream Apply 1 application topically as needed. Apply pea-size amount to suppository before inserting in anus 05/04/20   Trifan, Kermit Balo, MD  polyethylene glycol (MIRALAX) 17 g packet Take 17 g by mouth 2 (two) times daily. 09/29/20   Maczis, Elmer Sow, PA-C  pantoprazole (PROTONIX) 40 MG tablet Take 1 tablet (40 mg total) by mouth 2 (two) times daily before a meal. Patient not taking: Reported on 09/28/2015 01/27/15 05/05/20  Melene Plan, DO      Allergies    Patient has no known allergies.    Review of Systems   Review of Systems  Constitutional:  Negative for chills, diaphoresis and  fever.  Respiratory:  Positive for cough (chronic). Negative for shortness of breath.   Cardiovascular:  Positive for chest pain. Negative for palpitations and leg swelling.  Gastrointestinal:  Negative for abdominal pain, nausea and vomiting.  Musculoskeletal:  Positive for back pain.  Neurological:  Negative for syncope and light-headedness.    Physical Exam Updated Vital Signs BP (!) 177/106   Pulse 86   Temp 98.1 F (36.7 C)   Resp 17   Ht 5\' 9"  (1.753 m)   Wt 63.5 kg   SpO2 99%   BMI 20.67 kg/m  Physical Exam Vitals and nursing note reviewed.  Constitutional:      General: He is not in acute distress.    Appearance: He is not ill-appearing or toxic-appearing.  Eyes:     General: No scleral icterus. Cardiovascular:     Rate and Rhythm: Normal rate.     Pulses:          Radial pulses are 2+ on the right side and 2+ on the left side.  Pulmonary:     Effort: Pulmonary effort is normal.     Breath sounds: Normal breath sounds. No decreased breath sounds, wheezing or rhonchi.  Chest:     Chest wall: Tenderness present.       Comments: Tenderness to the marked area.  No crepitus, overlying skin change, flexion, induration, increase in erythema or warmth noted to the area. Musculoskeletal:  Arms:     Right lower leg: No tenderness. No edema.     Left lower leg: No tenderness. No edema.     Comments: Tenderness along the medial spine of the right scapula.  Palpable muscle spasm.  No overlying skin changes noted.  Skin:    General: Skin is warm and dry.  Neurological:     Mental Status: He is alert.     ED Results / Procedures / Treatments   Labs (all labs ordered are listed, but only abnormal results are displayed) Labs Reviewed  BASIC METABOLIC PANEL - Abnormal; Notable for the following components:      Result Value   BUN 21 (*)    Calcium 8.8 (*)    All other components within normal limits  CBC  LIPASE, BLOOD  HEPATIC FUNCTION PANEL  RAPID URINE  DRUG SCREEN, HOSP PERFORMED  URINALYSIS, ROUTINE W REFLEX MICROSCOPIC  TROPONIN I (HIGH SENSITIVITY)  TROPONIN I (HIGH SENSITIVITY)    EKG EKG Interpretation Date/Time:  Wednesday April 13 2023 12:18:06 EST Ventricular Rate:  82 PR Interval:  131 QRS Duration:  88 QT Interval:  380 QTC Calculation: 444 R Axis:   72  Text Interpretation: Sinus rhythm Confirmed by Kristine Royal [phone]) on 04/13/2023 4:08:26 PM  Radiology CT Angio Chest/Abd/Pel for Dissection W and/or Wo Contrast  Result Date: 04/13/2023 CLINICAL DATA:  Right-sided chest pain radiating to back and right shoulder, nonproductive cough for 5 days, pain with inhalation EXAM: CT ANGIOGRAPHY CHEST, ABDOMEN AND PELVIS TECHNIQUE: Non-contrast CT of the chest was initially obtained. Multidetector CT imaging through the chest, abdomen and pelvis was performed using the standard protocol during bolus administration of intravenous contrast. Multiplanar reconstructed images and MIPs were obtained and reviewed to evaluate the vascular anatomy. RADIATION DOSE REDUCTION: This exam was performed according to the departmental dose-optimization program which includes automated exposure control, adjustment of the mA and/or kV according to patient size and/or use of iterative reconstruction technique. CONTRAST:  OMNIPAQUE IOHEXOL 350 MG/ML SOLN COMPARISON:  04/13/2023 FINDINGS: CTA CHEST FINDINGS Cardiovascular: No evidence of thoracic aortic aneurysm or dissection. Great vessels are widely patent. There is technically adequate opacification of the pulmonary vasculature. No filling defects or pulmonary emboli. No evidence of pericardial effusion. Atherosclerosis within the LAD distribution of the coronary vasculature. Mediastinum/Nodes: No enlarged mediastinal, hilar, or axillary lymph nodes. Thyroid gland, trachea, and esophagus demonstrate no significant findings. Lungs/Pleura: Mild emphysema. No acute airspace disease, effusion, or  pneumothorax. Bibasilar hypoventilatory changes. Basilar predominant bronchial wall thickening consistent with bronchitis or reactive airway disease. Central airways are patent. Musculoskeletal: No acute or destructive bony abnormalities. Reconstructed images demonstrate no additional findings. Review of the MIP images confirms the above findings. CTA ABDOMEN AND PELVIS FINDINGS VASCULAR Aorta: Normal caliber aorta without aneurysm, dissection, vasculitis or significant stenosis. Mild atherosclerosis. Celiac: Patent without evidence of aneurysm, dissection, vasculitis or significant stenosis. SMA: Patent without evidence of aneurysm, dissection, vasculitis or significant stenosis. Renals: Both renal arteries are patent without evidence of aneurysm, dissection, vasculitis, fibromuscular dysplasia or significant stenosis. 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 Distal branches of the IMA opacify normally. No aneurysm, dissection, or vasculitis. Inflow: Patent without evidence of aneurysm, dissection, vasculitis or significant stenosis. Veins: No obvious venous abnormality within the limitations of this arterial phase study. Review of the MIP images confirms the above findings. NON-VASCULAR Hepatobiliary: No focal liver abnormality is seen. No gallstones, gallbladder wall thickening, or biliary dilatation. Pancreas: Unremarkable.  No pancreatic ductal dilatation or surrounding inflammatory changes. Spleen: Normal in size without focal abnormality. Adrenals/Urinary Tract: Adrenal glands are unremarkable. Kidneys are normal, without renal calculi, focal lesion, or hydronephrosis. Bladder is unremarkable. Stomach/Bowel: No bowel obstruction or ileus. Normal appendix right lower quadrant. No bowel wall thickening or inflammatory change. Lymphatic: No pathologic adenopathy within the abdomen or pelvis. Reproductive: Prostate is unremarkable. Other: No free fluid or free intraperitoneal  gas. No abdominal wall hernia. Musculoskeletal: No acute or destructive bony abnormalities. Reconstructed images demonstrate no additional findings. Review of the MIP images confirms the above findings. IMPRESSION: Vascular: 1. No evidence of thoracoabdominal aortic aneurysm or dissection. 2. No evidence of pulmonary embolus. 3. 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 Beyond the origin, the IMA is normal in caliber with normal opacification. 4.  Aortic Atherosclerosis (ICD10-I70.0). Nonvascular: 1.  Emphysema (ICD10-J43.9). 2. Basilar predominant bronchial wall thickening consistent with reactive airway disease or bronchitis. No evidence of pneumonia. 3. Coronary artery atherosclerosis. Electronically Signed   By: Sharlet Salina M.D.   On: 04/13/2023 19:43   DG Chest 2 View  Result Date: 04/13/2023 CLINICAL DATA:  Chest pain EXAM: CHEST - 2 VIEW COMPARISON:  04/16/2013 FINDINGS: The heart size and mediastinal contours are within normal limits. Both lungs are clear. The visualized skeletal structures are unremarkable. IMPRESSION: No active cardiopulmonary disease. Electronically Signed   By: Judie Petit.  Shick M.D.   On: 04/13/2023 14:15    Procedures .Critical Care  Performed by: Achille Rich, PA-C Authorized by: Achille Rich, PA-C   Critical care provider statement:    Critical care time (minutes):  30   Critical care was necessary to treat or prevent imminent or life-threatening deterioration of the following conditions: hypertensive urgency.   Critical care was time spent personally by me on the following activities:  Development of treatment plan with patient or surrogate, discussions with consultants, evaluation of patient's response to treatment, examination of patient, ordering and review of laboratory studies, ordering and review of radiographic studies, ordering and performing treatments and interventions, pulse oximetry, re-evaluation of patient's condition, review of old charts and obtaining  history from patient or surrogate   Care discussed with: admitting provider   Comments:     Requiring multiple IV medications    Medications Ordered in ED Medications  hydrALAZINE (APRESOLINE) injection 10 mg (10 mg Intravenous Given 04/13/23 1725)  iohexol (OMNIPAQUE) 350 MG/ML injection 100 mL (100 mLs Intravenous Contrast Given 04/13/23 1728)    ED Course/ Medical Decision Making/ A&P    Medical Decision Making Amount and/or Complexity of Data Reviewed Labs: ordered. Radiology: ordered.  Risk Prescription drug management. Decision regarding hospitalization.   42 y.o. male presents to the ER for evaluation of chest pain. Differential diagnosis includes but is not limited to ACS, pericarditis, myocarditis, aortic dissection, PE, pneumothorax, esophageal spasm or rupture, chronic angina, pneumonia, bronchitis, GERD, reflux/PUD, biliary disease, pancreatitis, costochondritis, anxiety. Vital signs BP significantly elevated at 197/115, otherwise unremarkable. Physical exam as noted above.   EKG reviewed and interpreted by my attending and read as sinus rhythm.  IV anti-hypertensive ordered.   I independently reviewed and interpreted the patient's labs. Urinalysis was colorless and concentrated urine, otherwise unremarkable. UDS positive for THC. Troponins flat at 7. Lipase within normal limits. CBC without leukocytosis or anemia. Hepatic function panel WNL. BMP shows BUN 21. Calcium 8.8.  CT imaging Vascular: 1. No evidence of thoracoabdominal aortic aneurysm or dissection. 2. No evidence of pulmonary embolus. 3. High-grade stenosis at the  origin of the IMA. Beyond the origin, the IMA is normal in caliber with normal opacification. 4.  Aortic Atherosclerosis (ICD10-I70.0). Nonvascular: 1.  Emphysema (ICD10-J43.9). 2. Basilar predominant bronchial wall thickening consistent with reactive airway disease or bronchitis. No evidence of pneumonia. 3. Coronary artery atherosclerosis. Per  radiologist's interpretation.   Patient is still experiencing chest pain, however it is reproduced with palpation. Given the significantly elevated BP without improvement after IV medication, will admit. Dr. Kirke Corin.  The patient was given IV hydralizine and labatelol without improvement of BP. Given that he is still having chest pain, however it is reproducible, I do feel that he needs to be admitted for hypertensive urgency.   I discussed this case with my attending physician who cosigned this note including patient's presenting symptoms, physical exam, and planned diagnostics and interventions. Attending physician stated agreement with plan or made changes to plan which were implemented.   Final Clinical Impression(s) / ED Diagnoses Final diagnoses:  Hypertensive urgency    Rx / DC Orders ED Discharge Orders     None         Achille Rich, Cordelia Poche 04/14/23 0214    Wynetta Fines, MD 04/19/23 1449    Achille Rich, PA-C 05/02/23 2209    Wynetta Fines, MD 05/09/23 [phone]

## 2023-04-13 NOTE — H&P (Signed)
History and Physical    Patient: Wesley Stewart UJW:[id number] DOB: 01-20-1981 DOA: 04/13/2023 DOS: the patient was seen and examined on 04/13/2023 PCP: Patient, No Pcp Per  Patient coming from: Home  Chief Complaint:  Chief Complaint  Patient presents with   Chest Pain   HPI: Wesley Stewart is a 42 y.o. male with medical history significant of alcoholic gastritis who presented to the ED for evaluation of chest pain.  Patient reports that while at work last Friday, he started having chest pain on the right chest.  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  The pain was mild in nature and described as sharp, constant and sometimes radiating through his back.  He denies any diaphoresis, nausea, vomiting, shortness of breath, dizziness, headaches or vision changes.  ED course: Severely hypertensive with SBP in the 170s to 200s.  Labs show K+ 4.3, creatinine 0.98, normal CBC, troponin 7->7, lipase 39, normal LFTs and UA, UDS positive for THC. CXR negative.  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  Received IV labetalol 10 mg x 1, IV hydralazine 10 mg x 1 and IV morphine 4 mg. Hospitalist consulted for admission.   Review of Systems: As mentioned in the history of present illness. All other systems reviewed and are negative. Past Medical History:  Diagnosis Date   Alcohol abuse    Alcoholic gastritis    Past Surgical History:  Procedure Laterality Date   FOREARM SURGERY     Social History:  reports that he has been smoking cigarettes. He has a 7.5 pack-year smoking history. He has never used smokeless tobacco. He reports current alcohol use. He reports current drug use. Frequency: 3.00 times per week. Drug: Marijuana.  Reports smoking about a half a pack of cigarettes per day since his teenage years.  Reports drinking about 4 beers a day.  Smokes marijuana  daily but denies any other illicit drug use.  Works at [employer].  No Known Allergies  Family History  Problem Relation Age of Onset   Stroke Other     Prior to Admission medications   Medication Sig Start Date End Date Taking? Authorizing Provider  amoxicillin-clavulanate (AUGMENTIN) 875-125 MG tablet Take 1 tablet by mouth every 12 (twelve) hours. Patient not taking: Reported on 04/13/2023 07/11/21   Teressa Lower, PA-C  lidocaine (LMX) 4 % cream Apply 1 application topically as needed. Patient not taking: Reported on 04/13/2023 04/15/20   Horton, Mayer Masker, MD  lidocaine (LMX) 4 % cream Apply 1 application topically as needed. Apply pea-size amount to suppository before inserting in anus Patient not taking: Reported on 04/13/2023 05/04/20   Terald Sleeper, MD  polyethylene glycol (MIRALAX) 17 g packet Take 17 g by mouth 2 (two) times daily. Patient not taking: Reported on 04/13/2023 09/29/20   Jacinto Halim, PA-C  pantoprazole (PROTONIX) 40 MG tablet Take 1 tablet (40 mg total) by mouth 2 (two) times daily before a meal. Patient not taking: Reported on 09/28/2015 01/27/15 05/05/20  Melene Plan, DO    Physical Exam: Vitals:   04/13/23 2100 04/13/23 2130 04/13/23 2145 04/13/23 2200  BP: (!) 170/111 (!) 169/107 (!) 175/116 (!) 166/114  Pulse: 75 72 80 78  Resp: 11 12 12 12   Temp:      TempSrc:      SpO2: 98% 97% 95% 96%  Weight:      Height:       General: Pleasant, well-appearing middle-age man laying in bed. No acute distress. Head: Normocephalic. Atraumatic. Chest wall: Moderate tenderness palpation of the right chest wall. No bruises or deformities. CV: RRR. No murmurs, rubs, or gallops. No LE edema Pulmonary: Lungs CTAB. Normal effort. Mild expiratory wheezes. No rales. Abdominal: Soft, nontender, nondistended. Normal bowel sounds. MSK: Slight curvature to spine. Normal ROM. Skin: Warm and dry. No obvious rash or lesions. Neuro: A&Ox3. Moves all extremities. Normal  sensation. No focal deficit. Psych: Normal mood and affect  Data Reviewed:  EKG shows normal sinus rhythm with mild LVH Troponins negative, CXR without acute cardiopulmonary disease.  Assessment and Plan:  Wesley Stewart is a 42 y.o. male with medical history significant of alcoholic gastritis who presented to the ED for evaluation of chest pain and found to have severely elevated blood pressure.  # Chest pain # MSK-related pain Young man with no known cardiac history presented with 5 days of chest pain described as sharp and worse with movement of the arm. Acute ACS ruled out with normal troponin and EKG. CT dissection study negative for aortic dissection, aortic aneurysm or pulmonary embolism. Patient with tenderness to palpation of the right chest wall consistent with MSK related pain. -Admit for observation -Lidocaine patch to right chest wall -Norco 5-375 mg every 8 hours as needed for pain -Telemetry  # Severe asymptomatic hypertension Patient found to have SBP in the 170s-200 on arrival.  He reports chest pain as described above but denies any headaches, dizziness, blurry vision or shortness of breath.  Labs are unremarkable.  UDS negative for cocaine. CXR normal.  EKG with NSR with mild LVH.  Patient with no evidence of endorgan damage. S/p IV labetalol and hydralazine in the ED with SBP still above 150.  Patient with likely undiagnosed hypertension at baseline.  Will start on oral antihypertensive and monitor closely overnight -Start amlodipine 10 mg daily -IV labetalol 10 mg q2h prn for SBPP >180 -Telemetry  # Hx alcoholic gastritis Denies any abdominal pain or heartburn -Protonix 40 mg daily  # High-grade IMA stenosis CTA C/A/P dissection study showed a high-grade stenosis at the origin of the IMA.  However beyond the origin of the IMA is normal in caliber with normal opacification.  Patient denies any abdominal pain. -CTM  # Emphysema CT shows evidence of emphysema and  reactive airway disease. Patient with mild expiratory wheezing on exam.  He has more than 10-pack-year history of tobacco use. He denies a diagnosis of COPD or asthma.  -As needed DuoNebs for shortness of breath or wheezing  # Tobacco use disorder Patient with more than 10-pack-year history of tobacco use.  He currently smokes 1/2ppd. Counseled patient on tobacco use and importance of tobacco cessation.  He is not interested in quitting or in nicotine patch at the moment.  Smoking cessation counseling for 4 minutes today, consider nicotine patch  I have discussed tobacco cessation with the patient.  I have counseled the patient regarding the negative impacts of continued tobacco use including but not limited to lung cancer, COPD, and cardiovascular disease.  I have discussed alternatives to tobacco and modalities that may help facilitate tobacco cessation including but not limited to biofeedback, hypnosis, and medications.  Total time spent with tobacco counseling was 4 minutes.    Advance Care Planning:   Code Status: Full Code   Consults: None  Family Communication: No family at bedside  Severity of Illness: The appropriate patient status for this patient is OBSERVATION. Observation status is judged to be reasonable and necessary in order to provide the required intensity of service to ensure the patient's safety. The patient's presenting symptoms, physical exam findings, and initial radiographic and laboratory data in the context of their medical condition is felt to place them at decreased risk for further clinical deterioration. Furthermore, it is anticipated that the patient will be medically stable for discharge from the hospital within 2 midnights of admission.   Author: Steffanie Rainwater, MD 04/13/2023 10:53 PM  For on call review [email or web address].

## 2023-04-13 NOTE — ED Notes (Signed)
Pt ambulated to bathroom well but stated his chest hurts

## 2023-04-13 NOTE — ED Notes (Signed)
Provider aware of no improvement in patient's BP and that pt states he feels worse after medication administration.

## 2023-04-14 ENCOUNTER — Encounter (HOSPITAL_COMMUNITY): Payer: Self-pay | Admitting: Student

## 2023-04-14 ENCOUNTER — Other Ambulatory Visit: Payer: Self-pay

## 2023-04-14 DIAGNOSIS — I160 Hypertensive urgency: Principal | ICD-10-CM

## 2023-04-14 DIAGNOSIS — F17200 Nicotine dependence, unspecified, uncomplicated: Secondary | ICD-10-CM

## 2023-04-14 DIAGNOSIS — R0789 Other chest pain: Secondary | ICD-10-CM | POA: Diagnosis not present

## 2023-04-14 LAB — HIV ANTIBODY (ROUTINE TESTING W REFLEX): HIV Screen 4th Generation wRfx: NONREACTIVE

## 2023-04-14 MED ORDER — HYDRALAZINE HCL 50 MG PO TABS
50.0000 mg | ORAL_TABLET | Freq: Three times a day (TID) | ORAL | Status: DC
  Administered 2023-04-14 – 2023-04-15 (×3): 50 mg via ORAL
  Filled 2023-04-14 (×3): qty 1

## 2023-04-14 NOTE — Progress Notes (Signed)
PROGRESS NOTE    Wesley Stewart  WUJ:[id number] DOB: 04/10/1981 DOA: 04/13/2023 PCP: Patient, No Pcp Per   Brief Narrative:  HPI: Wesley Stewart is a 42 y.o. male with medical history significant of alcoholic gastritis who presented to the ED for evaluation of chest pain.  Patient reports that while at work last Friday, he started having chest pain on the right chest.  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  The pain was mild in nature and described as sharp, constant and sometimes radiating through his back.  He denies any diaphoresis, nausea, vomiting, shortness of breath, dizziness, headaches or vision changes.   ED course: Severely hypertensive with SBP in the 170s to 200s.  Labs show K+ 4.3, creatinine 0.98, normal CBC, troponin 7->7, lipase 39, normal LFTs and UA, UDS positive for THC. CXR negative.  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  Received IV labetalol 10 mg x 1, IV hydralazine 10 mg x 1 and IV morphine 4 mg. Hospitalist consulted for admission.  Assessment & Plan:   Principal Problem:   Chest pain Active Problems:   Hypertensive urgency   Tobacco use disorder  Chest pain: Was musculoskeletal and has resolved.  ACS ruled out.  No more tenderness on palpation on the right side.   # Severe asymptomatic hypertension Patient found to have SBP in the 170s-200 on arrival.  No more chest pain.  Denied headache or dizziness or blurry vision.  Was started on amlodipine 10 mg p.o. daily by admitting hospitalist but blood pressure still elevated so I will start him on hydralazine 50 mg 3 times daily and continue as needed hydralazine.    # Hx alcoholic gastritis Denies any abdominal pain or heartburn -Protonix 40 mg daily   # High-grade IMA stenosis CTA C/A/P dissection study showed a high-grade stenosis at the origin of the IMA.  However  beyond the origin of the IMA is normal in caliber with normal opacification.  Patient denies any abdominal pain.  Recommend outpatient follow-up with vascular surgery.   # Emphysema/tobacco dependence CT shows evidence of emphysema and reactive airway disease. He has more than 10-pack-year history of tobacco use. He denies a diagnosis of COPD or asthma.  -As needed DuoNebs for shortness of breath or wheezing.  I have discussed tobacco cessation with the patient.  I have counseled the patient regarding the negative impacts of continued tobacco use including but not limited to lung cancer, COPD, and cardiovascular disease.  I have discussed alternatives to tobacco and modalities that may help facilitate tobacco cessation including but not limited to biofeedback, hypnosis, and medications.  Total time spent with tobacco counseling was 5 minutes.   DVT prophylaxis: enoxaparin (LOVENOX) injection 40 mg Start: 04/13/23 2230   Code Status: Full Code  Family Communication: Mother none present at bedside.  Plan of care discussed with patient in length and he/she verbalized understanding and agreed with it.  Status is: Observation The patient will require care spanning > 2 midnights and should be moved to inpatient because: Significantly elevated blood pressure which can be dangerous, needs titration of medications.   Estimated body mass index is 20.67 kg/m as calculated from the following:   Height as of this encounter: 5\' 9"  (1.753 m).   Weight as of this encounter: 63.5 kg.    Nutritional Assessment: Body mass index is  20.67 kg/m.Marland Kitchen Seen by dietician.  I agree with the assessment and plan as outlined below: Nutrition Status:        . Skin Assessment: I have examined the patient's skin and I agree with the wound assessment as performed by the wound care RN as outlined below:    Consultants:  None  Procedures:  None  Antimicrobials:  Anti-infectives (From admission, onward)    None          Subjective: Patient seen and examined.  He has no complaints.  Denied chest pain.  Mother at the bedside.  Objective: Vitals:   04/14/23 0948 04/14/23 0953 04/14/23 1154 04/14/23 1244  BP: (!) 182/102 (!) 185/122 (!) 178/109 (!) 192/108  Pulse: 62   69  Resp:      Temp: 98.4 F (36.9 C)   98.1 F (36.7 C)  TempSrc: Oral   Oral  SpO2: 100%   100%  Weight:      Height:        Intake/Output Summary (Last 24 hours) at 04/14/2023 1521 Last data filed at 04/14/2023 1254 Gross per 24 hour  Intake 240 ml  Output --  Net 240 ml   Filed Weights   04/13/23 1227  Weight: 63.5 kg    Examination:  General exam: Appears calm and comfortable  Respiratory system: Clear to auscultation. Respiratory effort normal. Cardiovascular system: S1 & S2 heard, RRR. No JVD, murmurs, rubs, gallops or clicks. No pedal edema. Gastrointestinal system: Abdomen is nondistended, soft and nontender. No organomegaly or masses felt. Normal bowel sounds heard. Central nervous system: Alert and oriented. No focal neurological deficits. Extremities: Symmetric 5 x 5 power. Skin: No rashes, lesions or ulcers Psychiatry: Judgement and insight appear normal. Mood & affect appropriate.    Data Reviewed: I have personally reviewed following labs and imaging studies  CBC: Recent Labs  Lab 04/13/23 1230  WBC 5.2  HGB 16.0  HCT 46.6  MCV 98.3  PLT 217   Basic Metabolic Panel: Recent Labs  Lab 04/13/23 1230  NA 135  K 4.3  CL 105  CO2 22  GLUCOSE 86  BUN 21*  CREATININE 0.98  CALCIUM 8.8*   GFR: Estimated Creatinine Clearance: 90 mL/min (by C-G formula based on SCr of 0.98 mg/dL). Liver Function Tests: Recent Labs  Lab 04/13/23 1541  AST 28  ALT 20  ALKPHOS 55  BILITOT 1.0  PROT 6.9  ALBUMIN 4.0   Recent Labs  Lab 04/13/23 1541  LIPASE 39   No results for input(s): "AMMONIA" in the last 168 hours. Coagulation Profile: No results for input(s): "INR", "PROTIME" in the  last 168 hours. Cardiac Enzymes: No results for input(s): "CKTOTAL", "CKMB", "CKMBINDEX", "TROPONINI" in the last 168 hours. BNP (last 3 results) No results for input(s): "PROBNP" in the last 8760 hours. HbA1C: No results for input(s): "HGBA1C" in the last 72 hours. CBG: No results for input(s): "GLUCAP" in the last 168 hours. Lipid Profile: No results for input(s): "CHOL", "HDL", "LDLCALC", "TRIG", "CHOLHDL", "LDLDIRECT" in the last 72 hours. Thyroid Function Tests: No results for input(s): "TSH", "T4TOTAL", "FREET4", "T3FREE", "THYROIDAB" in the last 72 hours. Anemia Panel: No results for input(s): "VITAMINB12", "FOLATE", "FERRITIN", "TIBC", "IRON", "RETICCTPCT" in the last 72 hours. Sepsis Labs: No results for input(s): "PROCALCITON", "LATICACIDVEN" in the last 168 hours.  No results found for this or any previous visit (from the past 240 hour(s)).   Radiology Studies: CT Angio Chest/Abd/Pel for Dissection W and/or Wo Contrast  Result Date:  04/13/2023 CLINICAL DATA:  Right-sided chest pain radiating to back and right shoulder, nonproductive cough for 5 days, pain with inhalation EXAM: CT ANGIOGRAPHY CHEST, ABDOMEN AND PELVIS TECHNIQUE: Non-contrast CT of the chest was initially obtained. Multidetector CT imaging through the chest, abdomen and pelvis was performed using the standard protocol during bolus administration of intravenous contrast. Multiplanar reconstructed images and MIPs were obtained and reviewed to evaluate the vascular anatomy. RADIATION DOSE REDUCTION: This exam was performed according to the departmental dose-optimization program which includes automated exposure control, adjustment of the mA and/or kV according to patient size and/or use of iterative reconstruction technique. CONTRAST:  OMNIPAQUE IOHEXOL 350 MG/ML SOLN COMPARISON:  04/13/2023 FINDINGS: CTA CHEST FINDINGS Cardiovascular: No evidence of thoracic aortic aneurysm or dissection. Great vessels are widely  patent. There is technically adequate opacification of the pulmonary vasculature. No filling defects or pulmonary emboli. No evidence of pericardial effusion. Atherosclerosis within the LAD distribution of the coronary vasculature. Mediastinum/Nodes: No enlarged mediastinal, hilar, or axillary lymph nodes. Thyroid gland, trachea, and esophagus demonstrate no significant findings. Lungs/Pleura: Mild emphysema. No acute airspace disease, effusion, or pneumothorax. Bibasilar hypoventilatory changes. Basilar predominant bronchial wall thickening consistent with bronchitis or reactive airway disease. Central airways are patent. Musculoskeletal: No acute or destructive bony abnormalities. Reconstructed images demonstrate no additional findings. Review of the MIP images confirms the above findings. CTA ABDOMEN AND PELVIS FINDINGS VASCULAR Aorta: Normal caliber aorta without aneurysm, dissection, vasculitis or significant stenosis. Mild atherosclerosis. Celiac: Patent without evidence of aneurysm, dissection, vasculitis or significant stenosis. SMA: Patent without evidence of aneurysm, dissection, vasculitis or significant stenosis. Renals: Both renal arteries are patent without evidence of aneurysm, dissection, vasculitis, fibromuscular dysplasia or significant stenosis. 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 Distal branches of the IMA opacify normally. No aneurysm, dissection, or vasculitis. Inflow: Patent without evidence of aneurysm, dissection, vasculitis or significant stenosis. Veins: No obvious venous abnormality within the limitations of this arterial phase study. Review of the MIP images confirms the above findings. NON-VASCULAR Hepatobiliary: No focal liver abnormality is seen. No gallstones, gallbladder wall thickening, or biliary dilatation. Pancreas: Unremarkable. No pancreatic ductal dilatation or surrounding inflammatory changes. Spleen: Normal in size without focal  abnormality. Adrenals/Urinary Tract: Adrenal glands are unremarkable. Kidneys are normal, without renal calculi, focal lesion, or hydronephrosis. Bladder is unremarkable. Stomach/Bowel: No bowel obstruction or ileus. Normal appendix right lower quadrant. No bowel wall thickening or inflammatory change. Lymphatic: No pathologic adenopathy within the abdomen or pelvis. Reproductive: Prostate is unremarkable. Other: No free fluid or free intraperitoneal gas. No abdominal wall hernia. Musculoskeletal: No acute or destructive bony abnormalities. Reconstructed images demonstrate no additional findings. Review of the MIP images confirms the above findings. IMPRESSION: Vascular: 1. No evidence of thoracoabdominal aortic aneurysm or dissection. 2. No evidence of pulmonary embolus. 3. 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 Beyond the origin, the IMA is normal in caliber with normal opacification. 4.  Aortic Atherosclerosis (ICD10-I70.0). Nonvascular: 1.  Emphysema (ICD10-J43.9). 2. Basilar predominant bronchial wall thickening consistent with reactive airway disease or bronchitis. No evidence of pneumonia. 3. Coronary artery atherosclerosis. Electronically Signed   By: Sharlet Salina M.D.   On: 04/13/2023 19:43   DG Chest 2 View  Result Date: 04/13/2023 CLINICAL DATA:  Chest pain EXAM: CHEST - 2 VIEW COMPARISON:  04/16/2013 FINDINGS: The heart size and mediastinal contours are within normal limits. Both lungs are clear. The visualized skeletal structures are unremarkable. IMPRESSION: No active cardiopulmonary disease.  Electronically Signed   By: Judie Petit.  Shick M.D.   On: 04/13/2023 14:15    Scheduled Meds:  amLODipine  10 mg Oral Daily   enoxaparin (LOVENOX) injection  40 mg Subcutaneous Q24H   hydrALAZINE  50 mg Oral TID   lidocaine  1 patch Transdermal Q24H   pantoprazole  40 mg Oral Daily   Continuous Infusions:   LOS: 0 days   Hughie Closs, MD [facility]  04/14/2023, 3:21 PM   *Please note  that this is a verbal dictation therefore any spelling or grammatical errors are due to the "[employer] One" system interpretation.  Please page via Amion and do not message via secure chat for urgent patient care matters. Secure chat can be used for non urgent patient care matters.  How to contact the [facility] Attending or Consulting provider 7A - 7P or covering provider during after hours 7P -7A, for this patient?  Check the care team in [facility] and look for a) attending/consulting [facility] provider listed and b) the [facility] team listed. Page or secure chat 7A-7P. Log into [email or web address] and use [place]'s universal password to access. If you do not have the password, please contact the hospital operator. Locate the [facility] provider you are looking for under [facility] and page to a number that you can be directly reached. If you still have difficulty reaching the provider, please page the [facility] (Director on Call) for the Hospitalists listed on amion for assistance.

## 2023-04-14 NOTE — ED Notes (Signed)
ED TO INPATIENT HANDOFF REPORT  ED Nurse Name and Phone #: Markeesha Char RN  S Name/Age/Gender Wesley Stewart 42 y.o. male Room/Bed: WA23/WA23  Code Status   Code Status: Full Code  Home/SNF/Other Home Patient oriented to: self, place, time, and situation  Is this baseline? Yes   Triage Complete: Triage complete  Chief Complaint Chest pain [R07.9]  Triage Note C/o right sided chest pain radiating through to back and right shoulder and non-productive  cough x5 days that started when working.  Pain is worse with inhalation and coughing.  Denies sob     Allergies No Known Allergies  Level of Care/Admitting Diagnosis ED Disposition     ED Disposition  Admit   Condition  --   Comment  Hospital Area: [facility] [[id number]]  Level of Care: Telemetry [5]  Admit to tele based on following criteria: Other see comments  Comments: hypertensive urgency  May place patient in observation at [facility] or [facility] if equivalent level of care is available:: No  Covid Evaluation: Asymptomatic - no recent exposure (last 10 days) testing not required  Diagnosis: Chest pain [744799]  Admitting Physician: Steffanie Rainwater [[id number]]  Attending Physician: Steffanie Rainwater [[id number]]          B Medical/Surgery History Past Medical History:  Diagnosis Date   Alcohol abuse    Alcoholic gastritis    Past Surgical History:  Procedure Laterality Date   FOREARM SURGERY       A IV Location/Drains/Wounds Patient Lines/Drains/Airways Status     Active Line/Drains/Airways     Name Placement date Placement time Site Days   Peripheral IV 04/13/23 20 G Anterior;Proximal;Right Forearm 04/13/23  1425  Forearm  1            Intake/Output Last 24 hours No intake or output data in the 24 hours ending 04/14/23 0421  Labs/Imaging Results for orders placed or performed during the hospital encounter of 04/13/23 (from the past 48 hour(s))  Basic  metabolic panel     Status: Abnormal   Collection Time: 04/13/23 12:30 PM  Result Value Ref Range   Sodium 135 135 - 145 mmol/L   Potassium 4.3 3.5 - 5.1 mmol/L   Chloride 105 98 - 111 mmol/L   CO2 22 22 - 32 mmol/L   Glucose, Bld 86 70 - 99 mg/dL    Comment: Glucose reference range applies only to samples taken after fasting for at least 8 hours.   BUN 21 (H) 6 - 20 mg/dL   Creatinine, Ser 1.91 0.61 - 1.24 mg/dL   Calcium 8.8 (L) 8.9 - 10.3 mg/dL   GFR, Estimated >47 >82 mL/min    Comment: (NOTE) Calculated using the CKD-EPI Creatinine Equation (2021)    Anion gap 8 5 - 15    Comment: Performed at [facility], [address] [place], [place] [address]  CBC     Status: None   Collection Time: 04/13/23 12:30 PM  Result Value Ref Range   WBC 5.2 4.0 - 10.5 K/uL   RBC 4.74 4.22 - 5.81 MIL/uL   Hemoglobin 16.0 13.0 - 17.0 g/dL   HCT 30.8 65.7 - 84.6 %   MCV 98.3 80.0 - 100.0 fL   MCH 33.8 26.0 - 34.0 pg   MCHC 34.3 30.0 - 36.0 g/dL   RDW 96.2 95.2 - 84.1 %   Platelets 217 150 - 400 K/uL   nRBC 0.0 0.0 - 0.2 %  Comment: Performed at [facility], [address] [place], [place] [address]  Troponin I (High Sensitivity)     Status: None   Collection Time: 04/13/23 12:30 PM  Result Value Ref Range   Troponin I (High Sensitivity) 7 <18 ng/L    Comment: (NOTE) Elevated high sensitivity troponin I (hsTnI) values and significant  changes across serial measurements may suggest ACS but many other  chronic and acute conditions are known to elevate hsTnI results.  Refer to the "Links" section for chest pain algorithms and additional  guidance. Performed at [facility], [address] [place], [place] [address]   Troponin I (High Sensitivity)     Status: None   Collection Time: 04/13/23  3:40 PM  Result Value Ref Range   Troponin I (High Sensitivity) 7 <18 ng/L    Comment: (NOTE) Elevated high sensitivity troponin I  (hsTnI) values and significant  changes across serial measurements may suggest ACS but many other  chronic and acute conditions are known to elevate hsTnI results.  Refer to the "Links" section for chest pain algorithms and additional  guidance. Performed at [facility], [address], [place], [place] [address]   Lipase, blood     Status: None   Collection Time: 04/13/23  3:41 PM  Result Value Ref Range   Lipase 39 11 - 51 U/L    Comment: Performed at [facility], [address] [place], [place] [address]  Hepatic function panel     Status: None   Collection Time: 04/13/23  3:41 PM  Result Value Ref Range   Total Protein 6.9 6.5 - 8.1 g/dL   Albumin 4.0 3.5 - 5.0 g/dL   AST 28 15 - 41 U/L   ALT 20 0 - 44 U/L   Alkaline Phosphatase 55 38 - 126 U/L   Total Bilirubin 1.0 <1.2 mg/dL   Bilirubin, Direct 0.1 0.0 - 0.2 mg/dL   Indirect Bilirubin 0.9 0.3 - 0.9 mg/dL    Comment: Performed at [facility], [address], [place], [place] [address]  Urine rapid drug screen (hosp performed)     Status: Abnormal   Collection Time: 04/13/23  6:26 PM  Result Value Ref Range   Opiates NONE DETECTED NONE DETECTED   Cocaine NONE DETECTED NONE DETECTED   Benzodiazepines NONE DETECTED NONE DETECTED   Amphetamines NONE DETECTED NONE DETECTED   Tetrahydrocannabinol POSITIVE (A) NONE DETECTED   Barbiturates NONE DETECTED NONE DETECTED    Comment: (NOTE) DRUG SCREEN FOR MEDICAL PURPOSES ONLY.  IF CONFIRMATION IS NEEDED FOR ANY PURPOSE, NOTIFY LAB WITHIN 5 DAYS.  LOWEST DETECTABLE LIMITS FOR URINE DRUG SCREEN Drug Class                     Cutoff (ng/mL) Amphetamine and metabolites    1000 Barbiturate and metabolites    200 Benzodiazepine                 200 Opiates and metabolites        300 Cocaine and metabolites        300 THC                            50 Performed at [facility], [address], [place], [place] [address]   Urinalysis, Routine w reflex microscopic -Urine, Clean Catch     Status: Abnormal  Collection Time: 04/13/23  6:27 PM  Result Value Ref Range   Color, Urine COLORLESS (A) YELLOW   APPearance CLEAR CLEAR   Specific Gravity, Urine 1.040 (H) 1.005 - 1.030   pH 6.0 5.0 - 8.0   Glucose, UA NEGATIVE NEGATIVE mg/dL   Hgb urine dipstick NEGATIVE NEGATIVE   Bilirubin Urine NEGATIVE NEGATIVE   Ketones, ur NEGATIVE NEGATIVE mg/dL   Protein, ur NEGATIVE NEGATIVE mg/dL   Nitrite NEGATIVE NEGATIVE   Leukocytes,Ua NEGATIVE NEGATIVE    Comment: Performed at [facility], [address], [place], [place] [address]   CT Angio Chest/Abd/Pel for Dissection W and/or Wo Contrast  Result Date: 04/13/2023 CLINICAL DATA:  Right-sided chest pain radiating to back and right shoulder, nonproductive cough for 5 days, pain with inhalation EXAM: CT ANGIOGRAPHY CHEST, ABDOMEN AND PELVIS TECHNIQUE: Non-contrast CT of the chest was initially obtained. Multidetector CT imaging through the chest, abdomen and pelvis was performed using the standard protocol during bolus administration of intravenous contrast. Multiplanar reconstructed images and MIPs were obtained and reviewed to evaluate the vascular anatomy. RADIATION DOSE REDUCTION: This exam was performed according to the departmental dose-optimization program which includes automated exposure control, adjustment of the mA and/or kV according to patient size and/or use of iterative reconstruction technique. CONTRAST:  OMNIPAQUE IOHEXOL 350 MG/ML SOLN COMPARISON:  04/13/2023 FINDINGS: CTA CHEST FINDINGS Cardiovascular: No evidence of thoracic aortic aneurysm or dissection. Great vessels are widely patent. There is technically adequate opacification of the pulmonary vasculature. No filling defects or pulmonary emboli. No evidence of pericardial effusion. Atherosclerosis within the LAD distribution of the coronary vasculature.  Mediastinum/Nodes: No enlarged mediastinal, hilar, or axillary lymph nodes. Thyroid gland, trachea, and esophagus demonstrate no significant findings. Lungs/Pleura: Mild emphysema. No acute airspace disease, effusion, or pneumothorax. Bibasilar hypoventilatory changes. Basilar predominant bronchial wall thickening consistent with bronchitis or reactive airway disease. Central airways are patent. Musculoskeletal: No acute or destructive bony abnormalities. Reconstructed images demonstrate no additional findings. Review of the MIP images confirms the above findings. CTA ABDOMEN AND PELVIS FINDINGS VASCULAR Aorta: Normal caliber aorta without aneurysm, dissection, vasculitis or significant stenosis. Mild atherosclerosis. Celiac: Patent without evidence of aneurysm, dissection, vasculitis or significant stenosis. SMA: Patent without evidence of aneurysm, dissection, vasculitis or significant stenosis. Renals: Both renal arteries are patent without evidence of aneurysm, dissection, vasculitis, fibromuscular dysplasia or significant stenosis. 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 Distal branches of the IMA opacify normally. No aneurysm, dissection, or vasculitis. Inflow: Patent without evidence of aneurysm, dissection, vasculitis or significant stenosis. Veins: No obvious venous abnormality within the limitations of this arterial phase study. Review of the MIP images confirms the above findings. NON-VASCULAR Hepatobiliary: No focal liver abnormality is seen. No gallstones, gallbladder wall thickening, or biliary dilatation. Pancreas: Unremarkable. No pancreatic ductal dilatation or surrounding inflammatory changes. Spleen: Normal in size without focal abnormality. Adrenals/Urinary Tract: Adrenal glands are unremarkable. Kidneys are normal, without renal calculi, focal lesion, or hydronephrosis. Bladder is unremarkable. Stomach/Bowel: No bowel obstruction or ileus. Normal appendix  right lower quadrant. No bowel wall thickening or inflammatory change. Lymphatic: No pathologic adenopathy within the abdomen or pelvis. Reproductive: Prostate is unremarkable. Other: No free fluid or free intraperitoneal gas. No abdominal wall hernia. Musculoskeletal: No acute or destructive bony abnormalities. Reconstructed images demonstrate no additional findings. Review of the MIP images confirms the above findings. IMPRESSION: Vascular: 1. No evidence of thoracoabdominal aortic aneurysm or dissection. 2. No evidence of pulmonary embolus.  3. 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 Beyond the origin, the IMA is normal in caliber with normal opacification. 4.  Aortic Atherosclerosis (ICD10-I70.0). Nonvascular: 1.  Emphysema (ICD10-J43.9). 2. Basilar predominant bronchial wall thickening consistent with reactive airway disease or bronchitis. No evidence of pneumonia. 3. Coronary artery atherosclerosis. Electronically Signed   By: Sharlet Salina M.D.   On: 04/13/2023 19:43   DG Chest 2 View  Result Date: 04/13/2023 CLINICAL DATA:  Chest pain EXAM: CHEST - 2 VIEW COMPARISON:  04/16/2013 FINDINGS: The heart size and mediastinal contours are within normal limits. Both lungs are clear. The visualized skeletal structures are unremarkable. IMPRESSION: No active cardiopulmonary disease. Electronically Signed   By: Judie Petit.  Shick M.D.   On: 04/13/2023 14:15    Pending Labs Unresulted Labs (From admission, onward)     Start     Ordered   04/14/23 0500  HIV Antibody (routine testing w rflx)  (HIV Antibody (Routine testing w reflex) panel)  Tomorrow morning,   R        04/13/23 2233            Vitals/Pain Today's Vitals   04/14/23 0100 04/14/23 0115 04/14/23 0148 04/14/23 0340  BP: (!) 168/118     Pulse: 62 66    Resp: 12     Temp:    98.3 F (36.8 C)  TempSrc:    Oral  SpO2: 96% 99%    Weight:      Height:      PainSc:   Asleep     Isolation Precautions No active  isolations  Medications Medications  enoxaparin (LOVENOX) injection 40 mg (40 mg Subcutaneous Given 04/13/23 2311)  acetaminophen (TYLENOL) tablet 650 mg (has no administration in time range)    Or  acetaminophen (TYLENOL) suppository 650 mg (has no administration in time range)  senna-docusate (Senokot-S) tablet 1 tablet (has no administration in time range)  ondansetron (ZOFRAN) tablet 4 mg (has no administration in time range)    Or  ondansetron (ZOFRAN) injection 4 mg (has no administration in time range)  amLODipine (NORVASC) tablet 10 mg (10 mg Oral Given 04/13/23 2311)  labetalol (NORMODYNE) injection 10 mg (has no administration in time range)  lidocaine (LIDODERM) 5 % 1 patch (1 patch Transdermal Patch Applied 04/13/23 2311)  HYDROcodone-acetaminophen (NORCO/VICODIN) 5-325 MG per tablet 1 tablet (has no administration in time range)  ipratropium-albuterol (DUONEB) 0.5-2.5 (3) MG/3ML nebulizer solution 3 mL (has no administration in time range)  pantoprazole (PROTONIX) EC tablet 40 mg (has no administration in time range)  hydrALAZINE (APRESOLINE) injection 10 mg (10 mg Intravenous Given 04/13/23 1725)  iohexol (OMNIPAQUE) 350 MG/ML injection 100 mL (100 mLs Intravenous Contrast Given 04/13/23 1728)  labetalol (NORMODYNE) injection 10 mg (10 mg Intravenous Given 04/13/23 2016)  morphine (PF) 4 MG/ML injection 4 mg (4 mg Intravenous Given 04/13/23 2105)    Mobility walks     Focused Assessments Cardiac Assessment Handoff:    No results found for: "CKTOTAL", "CKMB", "CKMBINDEX", "TROPONINI" No results found for: "DDIMER" Does the Patient currently have chest pain? No   , Neuro Assessment Handoff:  Swallow screen pass? Yes          Neuro Assessment:   Neuro Checks:      Has TPA been given? No If patient is a Neuro Trauma and patient is going to OR before floor call report to 4N Charge nurse: [phone] or [phone]  , Renal Assessment Handoff:  Hemodialysis  Schedule:  Last  Hemodialysis date and time:    Restricted appendage:    , Pulmonary Assessment Handoff:  Lung sounds:   O2 Device: Room Air      R Recommendations: See Admitting Provider Note  Report given to:   Additional Notes: hello this pt is getting admitted because of hypertensive urgency. Alert oriented room air, independent no complaints of discomfort right now. Bp is trending down.

## 2023-04-14 NOTE — Plan of Care (Signed)
  Problem: Education: Goal: Knowledge of General Education information will improve Description Including pain rating scale, medication(s)/side effects and non-pharmacologic comfort measures Outcome: Progressing   

## 2023-04-15 DIAGNOSIS — R0789 Other chest pain: Secondary | ICD-10-CM | POA: Diagnosis not present

## 2023-04-15 LAB — BASIC METABOLIC PANEL
Anion gap: 11 (ref 5–15)
BUN: 12 mg/dL (ref 6–20)
CO2: 22 mmol/L (ref 22–32)
Calcium: 9.4 mg/dL (ref 8.9–10.3)
Chloride: 102 mmol/L (ref 98–111)
Creatinine, Ser: 0.59 mg/dL — ABNORMAL LOW (ref 0.61–1.24)
GFR, Estimated: >60 mL/min (ref >60)
Glucose, Bld: 95 mg/dL (ref 70–99)
Potassium: 3.6 mmol/L (ref 3.5–5.1)
Sodium: 135 mmol/L (ref 135–145)

## 2023-04-15 MED ORDER — HYDRALAZINE HCL 100 MG PO TABS: 100.0000 mg | ORAL_TABLET | Freq: Three times a day (TID) | ORAL | 2 refills | Status: AC

## 2023-04-15 MED ORDER — AMLODIPINE BESYLATE 10 MG PO TABS: 10.0000 mg | ORAL_TABLET | Freq: Every day | ORAL | 2 refills | Status: AC

## 2023-04-15 MED ORDER — HYDRALAZINE HCL 20 MG/ML IJ SOLN: 10.0000 mg | Freq: Four times a day (QID) | INTRAMUSCULAR | Status: DC | PRN

## 2023-04-15 NOTE — Progress Notes (Signed)
Patient left hospital AMA. Patient provided w/ work excuse letter and BP medications were sent to patient's preferred pharmacy.

## 2023-04-15 NOTE — Discharge Summary (Signed)
@LOGO @   PT LEFT AMA SUMMARY  Wesley Stewart MRN - [id number] DOB - April 12, 1981  Date of Admission - 04/13/2023 Date LEFT AMA:   Attending Physician:  Hughie Closs, MD  Patient's PCP:  Patient, No Pcp Per  Disposition: LEFT AMA  Follow-up Appts:  Not able to be arranged or discussed as pt LEFT AMA  Diagnoses at time pt LEFT AMA: Hypertensive emergency  Initial presentation: HPI: Wesley Stewart is a 42 y.o. male with medical history significant of alcoholic gastritis who presented to the ED for evaluation of chest pain.  Patient reports that while at work last Friday, he started having chest pain on the right chest.  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  The pain was mild in nature and described as sharp, constant and sometimes radiating through his back.  He denies any diaphoresis, nausea, vomiting, shortness of breath, dizziness, headaches or vision changes.   ED course: Severely hypertensive with SBP in the 170s to 200s.  Labs show K+ 4.3, creatinine 0.98, normal CBC, troponin 7->7, lipase 39, normal LFTs and UA, UDS positive for THC. CXR negative.  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  Received IV labetalol 10 mg x 1, IV hydralazine 10 mg x 1 and IV morphine 4 mg. Hospitalist consulted for admission.  Hospital Course: Listed below are the active problems present, and the status of the care of these problems, at the time the pt decided to LEAVE AMA: Patient was admitted for chest pain rule out.  He was ruled out of ACS.  Chest pain resolved.  No more tenderness.  Chest pain most likely musculoskeletal.  He was also admitted with hypertensive urgency.  Initial BPs systolic 170s to 409 and diastolic over 100.  No history of hypertension in the past.  Has not seen any physician in a long time.  Was started on a scheduled amlodipine 10 mg and as  needed labetalol.  Blood pressure was still elevated yesterday on 04/14/2023 so hydralazine 50 mg 3 times daily was added.  Despite of this, blood pressure this morning was still significantly elevated with latest reading of 164/114.  This was despite of him getting morning medications.  Received a message from the nurse that patient has been very anxious and eager to leave today even before the 7 AM nurse shift started.  I told the nurse that I will be seeing him soon but based on the blood pressure, I do not think he should be leaving and I do not recommend him discharging.  Patient had called his ride.  He did not want to stay another night, did not want to event wait for me to see him.  He ended up leaving AGAINST MEDICAL ADVICE.  I have prescribed him amlodipine 10 mg and increased his hydralazine to 100 mg 3 times daily with the hope that his blood pressure will somewhat be under control.  Obviously, he needs close follow-up with a PCP to manage his blood pressure.  I had a lengthy discussion with the patient yesterday during my visit about importance of controlling his blood pressure and the consequences of uncontrolled blood pressure.    Medication List    Unable to be finalized as pt LEFT AMA  Day of Discharge Wt Readings from Last 3 Encounters:  04/13/23 63.5 kg  07/11/21 63 kg  09/27/20  64.9 kg   Temp Readings from Last 3 Encounters:  04/15/23 98.5 F (36.9 C) (Oral)  07/11/21 98.8 F (37.1 C) (Oral)  09/29/20 (!) 97.4 F (36.3 C) (Oral)   BP Readings from Last 3 Encounters:  04/15/23 (!) 164/114  07/11/21 (!) 158/108  09/29/20 (!) 162/93   Pulse Readings from Last 3 Encounters:  04/15/23 82  07/11/21 (!) 104  09/29/20 (!) 59    Physical Exam: Exam not able to be completed at time of d/c as pt LEFT AMA  8:31 AM 04/15/23  Hughie Closs, MD [facility] Office  [phone]

## 2023-04-15 NOTE — Plan of Care (Signed)
  Problem: Education: Goal: Knowledge of General Education information will improve Description Including pain rating scale, medication(s)/side effects and non-pharmacologic comfort measures Outcome: Progressing   Problem: Health Behavior/Discharge Planning: Goal: Ability to manage health-related needs will improve Outcome: Progressing
